# Patient Record
Sex: Female | Born: 1995 | Race: Black or African American | Hispanic: No | Smoking: Never smoker
Health system: Southern US, Community
[De-identification: ages and names within clinical notes are randomized; demographics above are authoritative.]

## PROBLEM LIST (undated history)

## (undated) DIAGNOSIS — Z789 Other specified health status: Secondary | ICD-10-CM

## (undated) DIAGNOSIS — A749 Chlamydial infection, unspecified: Secondary | ICD-10-CM

## (undated) HISTORY — DX: Chlamydial infection, unspecified: A74.9

## (undated) HISTORY — PX: NO PAST SURGERIES: SHX2092

---
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
## 2015-04-10 ENCOUNTER — Emergency Department (HOSPITAL_BASED_OUTPATIENT_CLINIC_OR_DEPARTMENT_OTHER): Payer: Medicaid Other

## 2015-04-10 ENCOUNTER — Emergency Department (HOSPITAL_BASED_OUTPATIENT_CLINIC_OR_DEPARTMENT_OTHER)
Admission: EM | Admit: 2015-04-10 | Discharge: 2015-04-10 | Disposition: A | Discharge status: Home / Self Care | Payer: Medicaid Other | Attending: Emergency Medicine | Admitting: Emergency Medicine

## 2015-04-10 ENCOUNTER — Encounter (HOSPITAL_BASED_OUTPATIENT_CLINIC_OR_DEPARTMENT_OTHER): Payer: Self-pay

## 2015-04-10 DIAGNOSIS — Z8619 Personal history of other infectious and parasitic diseases: Secondary | ICD-10-CM | POA: Diagnosis not present

## 2015-04-10 DIAGNOSIS — M546 Pain in thoracic spine: Secondary | ICD-10-CM

## 2015-04-10 DIAGNOSIS — Z3202 Encounter for pregnancy test, result negative: Secondary | ICD-10-CM | POA: Insufficient documentation

## 2015-04-10 DIAGNOSIS — R52 Pain, unspecified: Secondary | ICD-10-CM

## 2015-04-10 LAB — URINALYSIS, ROUTINE W REFLEX MICROSCOPIC
BILIRUBIN URINE: NEGATIVE
Glucose, UA: NEGATIVE mg/dL
Hgb urine dipstick: NEGATIVE
KETONES UR: NEGATIVE mg/dL
LEUKOCYTES UA: NEGATIVE
NITRITE: NEGATIVE
PROTEIN: NEGATIVE mg/dL
Specific Gravity, Urine: 1.021 (ref 1.005–1.030)
UROBILINOGEN UA: 1 mg/dL (ref 0.0–1.0)
pH: 8 (ref 5.0–8.0)

## 2015-04-10 LAB — PREGNANCY, URINE: PREG TEST UR: NEGATIVE

## 2015-04-10 MED ORDER — CYCLOBENZAPRINE HCL 10 MG PO TABS: 10.0000 mg | ORAL_TABLET | Freq: Two times a day (BID) | ORAL | Status: DC | PRN

## 2015-04-10 MED ORDER — NAPROXEN 500 MG PO TABS: 500.0000 mg | ORAL_TABLET | Freq: Two times a day (BID) | ORAL | Status: DC

## 2015-04-10 MED ORDER — HYDROCODONE-ACETAMINOPHEN 5-325 MG PO TABS
1.0000 | ORAL_TABLET | ORAL | Status: AC
  Administered 2015-04-10: 1 via ORAL
  Filled 2015-04-10: qty 1
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
## 2015-04-10 NOTE — Discharge Instructions (Signed)

## 2015-04-10 NOTE — ED Notes (Addendum)
C/o upper back pain since 1030am-denies recent injury-NAD- steady gait-pain worse with movement

## 2015-04-10 NOTE — ED Notes (Signed)
Patient transported to X-ray 

## 2017-12-01 LAB — OB RESULTS CONSOLE ABO/RH: RH Type: POSITIVE

## 2017-12-01 LAB — OB RESULTS CONSOLE ANTIBODY SCREEN: Antibody Screen: NEGATIVE

## 2018-06-13 NOTE — L&D Delivery Note (Signed)
OB/GYN Faculty Practice Delivery Note  Jarrett Sohoiffany Florer is a 23 y.o. G1P1001 s/p SVD at 4550w0d. She was admitted for spontaneous onset of labor, SROM.   ROM: 6h 3260m with clear fluid GBS Status: positive - one dose of PCN administered Maximum Maternal Temperature: Temp (24hrs), Avg:98 F (36.7 C), Min:97.7 F (36.5 C), Max:98.1 F (36.7 C)  Labor Progress: . Admitted in active labor . Minimal change from 4 to 5.5, pitocin started to help with frequency of contractions . Progressed to complete   Delivery Date/Time: 07/08/18 at 0559 Delivery: Called to room and patient was complete and pushing. Head delivered OA. No nuchal cord present. Shoulder and body delivered in usual fashion. Infant with spontaneous cry, placed on mother's abdomen, dried and stimulated. Cord clamped x 2 after 1-minute delay, and cut by father of baby. Cord blood drawn. Placenta delivered spontaneously with gentle cord traction. Fundus firm with massage and Pitocin. Labia, perineum, vagina, and cervix inspected inspected with sulcal laceration.   Placenta: spontaneous, intact, 3-vessel cord - to be discarded Complications: fetal decelerations with pushing - pushed in between contractions with excellent maternal effort Lacerations: sulcal repaired with 3-0 Vicryl EBL: 36ml Analgesia: none  Postpartum Planning [x]  message to sent to schedule follow-up  [x]  vaccines UTD  Infant: vigorous infant  APGARs 8, 9  3040g  Liahm Grivas S. Earlene PlaterWallace, DO OB/GYN Fellow, Faculty Practice

## 2018-06-27 LAB — OB RESULTS CONSOLE GC/CHLAMYDIA
Chlamydia: NEGATIVE
Gonorrhea: NEGATIVE

## 2018-06-27 LAB — OB RESULTS CONSOLE GBS: STREP GROUP B AG: POSITIVE

## 2018-06-30 DIAGNOSIS — O99820 Streptococcus B carrier state complicating pregnancy: Secondary | ICD-10-CM

## 2018-07-08 ENCOUNTER — Encounter (HOSPITAL_COMMUNITY): Payer: Self-pay

## 2018-07-08 ENCOUNTER — Inpatient Hospital Stay (HOSPITAL_COMMUNITY)
Admission: AD | Admit: 2018-07-08 | Discharge: 2018-07-10 | DRG: 807 | Disposition: A | Discharge status: Home / Self Care | Payer: PRIVATE HEALTH INSURANCE | Source: Ambulatory Visit | Attending: Obstetrics & Gynecology | Admitting: Obstetrics & Gynecology

## 2018-07-08 ENCOUNTER — Other Ambulatory Visit: Payer: Self-pay

## 2018-07-08 DIAGNOSIS — Z3A38 38 weeks gestation of pregnancy: Secondary | ICD-10-CM | POA: Diagnosis not present

## 2018-07-08 DIAGNOSIS — O99214 Obesity complicating childbirth: Secondary | ICD-10-CM | POA: Diagnosis present

## 2018-07-08 DIAGNOSIS — Z3483 Encounter for supervision of other normal pregnancy, third trimester: Secondary | ICD-10-CM | POA: Diagnosis present

## 2018-07-08 DIAGNOSIS — O99820 Streptococcus B carrier state complicating pregnancy: Secondary | ICD-10-CM

## 2018-07-08 DIAGNOSIS — O4202 Full-term premature rupture of membranes, onset of labor within 24 hours of rupture: Secondary | ICD-10-CM

## 2018-07-08 DIAGNOSIS — O99824 Streptococcus B carrier state complicating childbirth: Secondary | ICD-10-CM | POA: Diagnosis present

## 2018-07-08 HISTORY — DX: Other specified health status: Z78.9

## 2018-07-08 LAB — URINALYSIS, ROUTINE W REFLEX MICROSCOPIC
Bilirubin Urine: NEGATIVE
Glucose, UA: NEGATIVE mg/dL
HGB URINE DIPSTICK: NEGATIVE
Ketones, ur: NEGATIVE mg/dL
LEUKOCYTES UA: NEGATIVE
NITRITE: NEGATIVE
PROTEIN: NEGATIVE mg/dL
Specific Gravity, Urine: 1.014 (ref 1.005–1.030)
pH: 7 (ref 5.0–8.0)

## 2018-07-08 LAB — POCT FERN TEST: POCT FERN TEST: POSITIVE

## 2018-07-08 LAB — CBC
HCT: 34.6 % — ABNORMAL LOW (ref 36.0–46.0)
Hemoglobin: 11.6 g/dL — ABNORMAL LOW (ref 12.0–15.0)
MCH: 27.5 pg (ref 26.0–34.0)
MCHC: 33.5 g/dL (ref 30.0–36.0)
MCV: 82 fL (ref 80.0–100.0)
Platelets: 171 10*3/uL (ref 150–400)
RBC: 4.22 MIL/uL (ref 3.87–5.11)
RDW: 13.9 % (ref 11.5–15.5)
WBC: 9.4 10*3/uL (ref 4.0–10.5)
nRBC: 0 % (ref 0.0–0.2)

## 2018-07-08 LAB — TYPE AND SCREEN
ABO/RH(D): O POS
Antibody Screen: NEGATIVE

## 2018-07-08 LAB — ABO/RH: ABO/RH(D): O POS

## 2018-07-08 LAB — RPR: RPR Ser Ql: NONREACTIVE

## 2018-07-08 MED ORDER — ACETAMINOPHEN 325 MG PO TABS: 650.0000 mg | ORAL_TABLET | ORAL | Status: DC | PRN

## 2018-07-08 MED ORDER — EPHEDRINE 5 MG/ML INJ
10.0000 mg | INTRAVENOUS | Status: DC | PRN
  Filled 2018-07-08: qty 2

## 2018-07-08 MED ORDER — SIMETHICONE 80 MG PO CHEW: 80.0000 mg | CHEWABLE_TABLET | ORAL | Status: DC | PRN

## 2018-07-08 MED ORDER — ONDANSETRON HCL 4 MG PO TABS: 4.0000 mg | ORAL_TABLET | ORAL | Status: DC | PRN

## 2018-07-08 MED ORDER — OXYCODONE-ACETAMINOPHEN 5-325 MG PO TABS: 2.0000 | ORAL_TABLET | ORAL | Status: DC | PRN

## 2018-07-08 MED ORDER — SENNOSIDES-DOCUSATE SODIUM 8.6-50 MG PO TABS
2.0000 | ORAL_TABLET | ORAL | Status: DC
  Administered 2018-07-09 (×2): 2 via ORAL
  Filled 2018-07-08 (×2): qty 2

## 2018-07-08 MED ORDER — FENTANYL 2.5 MCG/ML BUPIVACAINE 1/10 % EPIDURAL INFUSION (WH - ANES): 14.0000 mL/h | INTRAMUSCULAR | Status: DC | PRN

## 2018-07-08 MED ORDER — LIDOCAINE HCL (PF) 1 % IJ SOLN
30.0000 mL | INTRAMUSCULAR | Status: AC | PRN
  Administered 2018-07-08: 30 mL via SUBCUTANEOUS
  Filled 2018-07-08: qty 30

## 2018-07-08 MED ORDER — LACTATED RINGERS IV SOLN: 500.0000 mL | Freq: Once | INTRAVENOUS | Status: DC

## 2018-07-08 MED ORDER — FENTANYL CITRATE (PF) 100 MCG/2ML IJ SOLN
100.0000 ug | INTRAMUSCULAR | Status: DC | PRN
  Administered 2018-07-08: 100 ug via INTRAVENOUS
  Filled 2018-07-08: qty 2

## 2018-07-08 MED ORDER — DIBUCAINE 1 % RE OINT: 1.0000 "application " | TOPICAL_OINTMENT | RECTAL | Status: DC | PRN

## 2018-07-08 MED ORDER — BENZOCAINE-MENTHOL 20-0.5 % EX AERO: 1.0000 "application " | INHALATION_SPRAY | CUTANEOUS | Status: DC | PRN

## 2018-07-08 MED ORDER — PHENYLEPHRINE 40 MCG/ML (10ML) SYRINGE FOR IV PUSH (FOR BLOOD PRESSURE SUPPORT)
80.0000 ug | PREFILLED_SYRINGE | INTRAVENOUS | Status: DC | PRN
  Filled 2018-07-08: qty 10

## 2018-07-08 MED ORDER — OXYTOCIN 40 UNITS IN NORMAL SALINE INFUSION - SIMPLE MED
2.5000 [IU]/h | INTRAVENOUS | Status: DC
  Filled 2018-07-08: qty 1000

## 2018-07-08 MED ORDER — ONDANSETRON HCL 4 MG/2ML IJ SOLN: 4.0000 mg | INTRAMUSCULAR | Status: DC | PRN

## 2018-07-08 MED ORDER — TERBUTALINE SULFATE 1 MG/ML IJ SOLN
0.2500 mg | Freq: Once | INTRAMUSCULAR | Status: DC | PRN
  Filled 2018-07-08: qty 1

## 2018-07-08 MED ORDER — OXYTOCIN 40 UNITS IN NORMAL SALINE INFUSION - SIMPLE MED
1.0000 m[IU]/min | INTRAVENOUS | Status: DC
  Administered 2018-07-08: 1.5 m[IU]/min via INTRAVENOUS

## 2018-07-08 MED ORDER — ONDANSETRON HCL 4 MG/2ML IJ SOLN
4.0000 mg | Freq: Four times a day (QID) | INTRAMUSCULAR | Status: DC | PRN
  Administered 2018-07-08: 4 mg via INTRAVENOUS
  Filled 2018-07-08: qty 2

## 2018-07-08 MED ORDER — LACTATED RINGERS IV SOLN: 500.0000 mL | INTRAVENOUS | Status: DC | PRN

## 2018-07-08 MED ORDER — DIPHENHYDRAMINE HCL 50 MG/ML IJ SOLN: 12.5000 mg | INTRAMUSCULAR | Status: DC | PRN

## 2018-07-08 MED ORDER — OXYTOCIN BOLUS FROM INFUSION
500.0000 mL | Freq: Once | INTRAVENOUS | Status: AC
  Administered 2018-07-08: 500 mL via INTRAVENOUS

## 2018-07-08 MED ORDER — COCONUT OIL OIL
1.0000 "application " | TOPICAL_OIL | Status: DC | PRN
  Administered 2018-07-10: 1 via TOPICAL
  Filled 2018-07-08: qty 120

## 2018-07-08 MED ORDER — DIPHENHYDRAMINE HCL 25 MG PO CAPS: 25.0000 mg | ORAL_CAPSULE | Freq: Four times a day (QID) | ORAL | Status: DC | PRN

## 2018-07-08 MED ORDER — IBUPROFEN 600 MG PO TABS
600.0000 mg | ORAL_TABLET | Freq: Four times a day (QID) | ORAL | Status: DC
  Administered 2018-07-08 – 2018-07-10 (×8): 600 mg via ORAL
  Filled 2018-07-08 (×8): qty 1

## 2018-07-08 MED ORDER — PRENATAL MULTIVITAMIN CH
1.0000 | ORAL_TABLET | Freq: Every day | ORAL | Status: DC
  Administered 2018-07-08 – 2018-07-09 (×2): 1 via ORAL
  Filled 2018-07-08 (×2): qty 1

## 2018-07-08 MED ORDER — TETANUS-DIPHTH-ACELL PERTUSSIS 5-2.5-18.5 LF-MCG/0.5 IM SUSP: 0.5000 mL | Freq: Once | INTRAMUSCULAR | Status: DC

## 2018-07-08 MED ORDER — LACTATED RINGERS IV SOLN
INTRAVENOUS | Status: DC
  Administered 2018-07-08: 02:00:00 via INTRAVENOUS

## 2018-07-08 MED ORDER — MEASLES, MUMPS & RUBELLA VAC IJ SOLR
0.5000 mL | Freq: Once | INTRAMUSCULAR | Status: DC
  Filled 2018-07-08: qty 0.5

## 2018-07-08 MED ORDER — ACETAMINOPHEN 325 MG PO TABS
650.0000 mg | ORAL_TABLET | ORAL | Status: DC | PRN
  Administered 2018-07-08 (×2): 650 mg via ORAL
  Filled 2018-07-08 (×2): qty 2

## 2018-07-08 MED ORDER — WITCH HAZEL-GLYCERIN EX PADS: 1.0000 "application " | MEDICATED_PAD | CUTANEOUS | Status: DC | PRN

## 2018-07-08 MED ORDER — PENICILLIN G 3 MILLION UNITS IVPB - SIMPLE MED
3.0000 10*6.[IU] | INTRAVENOUS | Status: DC
  Filled 2018-07-08: qty 100

## 2018-07-08 MED ORDER — ZOLPIDEM TARTRATE 5 MG PO TABS: 5.0000 mg | ORAL_TABLET | Freq: Every evening | ORAL | Status: DC | PRN

## 2018-07-08 MED ORDER — SODIUM CHLORIDE 0.9 % IV SOLN
5.0000 10*6.[IU] | INTRAVENOUS | Status: AC
  Administered 2018-07-08: 5 10*6.[IU] via INTRAVENOUS
  Filled 2018-07-08: qty 5

## 2018-07-08 MED ORDER — SOD CITRATE-CITRIC ACID 500-334 MG/5ML PO SOLN: 30.0000 mL | ORAL | Status: DC | PRN

## 2018-07-08 MED ORDER — OXYCODONE-ACETAMINOPHEN 5-325 MG PO TABS: 1.0000 | ORAL_TABLET | ORAL | Status: DC | PRN

## 2018-07-08 NOTE — Lactation Note (Signed)
This note was copied from a baby's chart. Lactation Consultation Note  Patient Name: Girl Minday Guinan Today's Date: 07/08/2018 Reason for consult: Initial assessment;Early term 37-38.6wks;Primapara;1st time breastfeeding  2 hours old early term female who is being partially BF and formula fed by her mother, she's a P1. Mom took BF classes at the [facility] office in the [facility]. LC showed mom how to hand express, but no colostrum was seen at this point. Mom has large breast and her nipples are very short shafted and semi-compressible. LC set her up with breast shells. Her RN has also set up a DEBP for her, but LC had to adjust the junctures in her pump, they're loose. Mom hasn't started pumping yet, but she will today; she's aware that pumping at this stage is mainly for breast stimulation and not to get volume. She has a Motif DEBP at home.   Baby was getting her hearing test when entering the room, as soon as NT was done, [facility] offered assistance with latch but mom politely declined, she was about to have her lunch. Asked mom to call for assistance when needed. Discussed cluster feeding, normal newborn behavior and pumping schedule.   Feeding plan:  1. Encouraged mom to feed baby STS 8-12 times/24 hours or sooner if feeding cues are present 2. Mom will pump after feedings and will feed baby any amount of EBM she may get 3. Mom will continue to supplement baby with [employer] Gentle in the meantime (waiting to get some colostrum) 4. She'll start wearing her breast shells tomorrow when her family member bring her nursing bra to the hospital  BF brochure, BF resources and feeding diary were reviewed. Mom reported all questions and concerns were answered, she's aware of LC services and will call PRN.  Maternal Data Formula Feeding for Exclusion: No Has patient been taught Hand Expression?: Yes Does the patient have breastfeeding experience prior to this delivery?: No  Feeding Feeding Type: Bottle Fed -  Formula  Interventions Interventions: Breast feeding basics reviewed;Breast massage;Hand express;Breast compression;Shells;Reverse pressure;DEBP  Lactation Tools Discussed/Used Tools: Shells;Pump Shell Type: Inverted Breast pump type: Double-Electric Breast Pump WIC Program: Yes Pump Review: Setup, frequency, and cleaning Initiated by:: RN and IBCLC (adjusted junctures in her pump) Date initiated:: 07/09/18   Consult Status Consult Status: Follow-up Date: 07/09/18 Follow-up type: In-patient    Malicia Blasdel Venetia Constable 07/08/2018, 3:18 PM

## 2018-07-08 NOTE — Discharge Summary (Signed)
Obstetrics Discharge Summary OB/GYN Faculty Practice   Patient Name: Jasmine Myers DOB: Jan 06, 1996 MRN: [id number]  Date of admission: 07/08/2018 Delivering MD: Rollene Rotunda A   Date of discharge: 07/10/2018  Admitting diagnosis: 38 WKS CTX AND PRESSURE Intrauterine pregnancy: [redacted]w[redacted]d     Secondary diagnosis:   Active Problems:   Indication for care in labor or delivery   GBS (group B Streptococcus carrier), +RV culture, currently pregnant   Morbid obesity [facility])    Discharge diagnosis: Term Pregnancy Delivered                                            Postpartum procedures: None  Complications: None  Outpatient Follow-Up: [ ]  07/12/2018 [facility] [ ]  plans for IUD for postpartum birth control   Hospital course: Jasmine Myers is a 23 y.o. [redacted]w[redacted]d who was admitted for spontaneous onset of labor, SROM. Her pregnancy was complicated by GBS positive, BMI 41. Her labor course was notable for admission in active labor, starting pitocin after minimal change for several hours, progressing to complete. Delivery was complicated by inadequate GBS prophylaxis, sulcal laceration. Please see delivery/op note for additional details. Her postpartum course was uncomplicated. She was breastfeeding without difficulty. By day of discharge, she was passing flatus, urinating, eating and drinking without difficulty. Her pain was well-controlled, and she was discharged home with ibuprofen and bowel regimen. She will follow-up in clinic in 3 days.   Physical exam  Vitals:   07/09/18 0640 07/09/18 1445 07/09/18 2234 07/10/18 0638  BP: 130/84 (!) 97/56 114/72 94/63  Pulse: 86 67 (!) 58 63  Resp: 20 18 18 16   Temp: 98 F (36.7 C) 98.3 F (36.8 C) 97.7 F (36.5 C) 97.9 F (36.6 C)  TempSrc: Oral Oral Oral Oral  SpO2: 98%  100%   Weight:      Height:       General: tired-appearing, NAD Lochia: appropriate Uterine Fundus: firm Incision: N/A DVT Evaluation: No significant  calf/ankle edema Labs: Lab Results  Component Value Date   WBC 9.4 07/08/2018   HGB 11.6 (L) 07/08/2018   HCT 34.6 (L) 07/08/2018   MCV 82.0 07/08/2018   PLT 171 07/08/2018   No flowsheet data found.  Discharge instructions: Per After Visit Summary and "Baby and Me Booklet"  After visit meds:  Allergies as of 07/10/2018   No Known Allergies     Medication List    TAKE these medications   ibuprofen 600 MG tablet Commonly known as:  ADVIL,MOTRIN Take 1 tablet (600 mg total) by mouth every 6 (six) hours.   prenatal multivitamin Tabs tablet Take 1 tablet by mouth daily at 12 noon.   senna-docusate 8.6-50 MG tablet Commonly known as:  Senokot-S Take 2 tablets by mouth at bedtime as needed for mild constipation.      Postpartum contraception: Undecided Diet: Routine Diet Activity: Advance as tolerated. Pelvic rest for 6 weeks.   Follow-up Appt:No future appointments. Message not sent as goes to [facility] practice Follow-up Visit:No follow-ups on file.   Newborn Data: Live born female  Birth Weight: 6 lb 11.2 oz (3040 g) APGAR: 8, 9  Newborn Delivery   Birth date/time:  07/08/2018 05:59:00 Delivery type:  Vaginal, Spontaneous    Baby Feeding: Breast Disposition:home with mother  Cristal Deer. Earlene Plater, DO OB/GYN Fellow, Faculty Practice

## 2018-07-08 NOTE — MAU Note (Signed)
Pt presents with cxts, approx 5-10 min apart and watery leakage starting around 11:50p yesterday. Pain 8/10. Pos FM. Pt lost mucus plug approx 1 wk ago.    Adah Perl RN

## 2018-07-08 NOTE — H&P (Addendum)
OBSTETRIC ADMISSION HISTORY AND PHYSICAL  Jasmine Myers is a 23 y.o. female G1P0 with IUP at [redacted]w[redacted]d by [place] presenting for contractions and ROM. She was admitted to labor and delivery for latent labor with SROM.   Reports fetal movement. Denies vaginal bleeding. Patient complains of contractions approximately 5-10 min apart with ROM approximately at 2350 on 07/07/2018. She has had no complications with prenatal care.   She received her prenatal care at [facility] .  Support person in labor: FOB  Ultrasounds . Anatomy U/S: normal (05/07/2018), posterior placenta, mild bilateral renal pelvic fullness   Prenatal History/Complications: Marland Kitchen GBS positive . Morbid obesity, BMI 41  Past Medical History: Past Medical History:  Diagnosis Date  . Chlamydia infection 07/29/2011  . Medical history non-contributory     Past Surgical History: Past Surgical History:  Procedure Laterality Date  . NO PAST SURGERIES      Obstetrical History: OB History    Gravida  1   Para      Term      Preterm      AB      Living        SAB      TAB      Ectopic      Multiple      Live Births              Social History: Social History   Socioeconomic History  . Marital status: Single    Spouse name: Not on file  . Number of children: Not on file  . Years of education: Not on file  . Highest education level: Not on file  Occupational History  . Not on file  Social Needs  . Financial resource strain: Not on file  . Food insecurity:    Worry: Never true    Inability: Never true  . Transportation needs:    Medical: No    Non-medical: No  Tobacco Use  . Smoking status: Never Smoker  . Smokeless tobacco: Never Used  Substance and Sexual Activity  . Alcohol use: No  . Drug use: No  . Sexual activity: Yes    Birth control/protection: None    Comment: last IC-yesterday   Lifestyle  . Physical activity:    Days per week: Patient refused    Minutes per  session: Patient refused  . Stress: Not on file  Relationships  . Social connections:    Talks on phone: More than three times a week    Gets together: More than three times a week    Attends religious service: More than 4 times per year    Active member of club or organization: Not on file    Attends meetings of clubs or organizations: Not on file    Relationship status: Living with partner  Other Topics Concern  . Not on file  Social History Narrative   ** Merged History Encounter **        Family History: History reviewed. No pertinent family history.  Allergies: No Known Allergies  Medications Prior to Admission  Medication Sig Dispense Refill Last Dose  . naproxen (NAPROSYN) 500 MG tablet Take 1 tablet (500 mg total) by mouth 2 (two) times daily with a meal. As needed for pain 20 tablet 0   . Prenatal Vit-Fe Fumarate-FA (PRENATAL MULTIVITAMIN) TABS tablet Take 1 tablet by mouth daily at 12 noon.   07/07/2018 at Unknown time  . cyclobenzaprine (FLEXERIL) 10 MG tablet Take 1  tablet (10 mg total) by mouth 2 (two) times daily as needed for muscle spasms. 20 tablet 0      Review of Systems  All systems reviewed and negative except as stated in HPI  Blood pressure (!) 102/49, pulse 94, temperature 98.1 F (36.7 C), temperature source Oral, resp. rate 20, height 5\' 5"  (1.651 m), weight 113.2 kg, SpO2 99 %. General appearance: alert, cooperative and moderate distress Lungs: no respiratory distress Heart: regular rate  Abdomen: soft, non-tender; gravid b Extremities: Homans sign is negative, no sign of DVT Presentation: cephalic Fetal monitoring: Cat 1 Dilation: 10 Effacement (%): 100 Station: Plus 1 Exam by:: Aundria Rud RN  Prenatal labs: ABO, Rh: --/--/O POS, O POS Performed at [facility], [address], [place], [place] [address]  (01/26 0150) Antibody: NEG (01/26 0150) Rubella:  Positive  RPR:   Non-reactive  HBsAg:   Non-reactive  HIV:   Non-reactive  GBS:  Positive (01/15 0000)  Genetic screening:  No AFP, Sickle cell neg  Prenatal Transfer Tool  Maternal Diabetes: No Genetic Screening: Declined Maternal Ultrasounds/Referrals: Normal Fetal Ultrasounds or other Referrals:  None Maternal Substance Abuse:  No Significant Maternal Medications:  None Significant Maternal Lab Results: None  Results for orders placed or performed during the hospital encounter of 07/08/18 (from the past 24 hour(s))  Urinalysis, Routine w reflex microscopic   Collection Time: 07/08/18 12:58 AM  Result Value Ref Range   Color, Urine YELLOW YELLOW   APPearance CLEAR CLEAR   Specific Gravity, Urine 1.014 1.005 - 1.030   pH 7.0 5.0 - 8.0   Glucose, UA NEGATIVE NEGATIVE mg/dL   Hgb urine dipstick NEGATIVE NEGATIVE   Bilirubin Urine NEGATIVE NEGATIVE   Ketones, ur NEGATIVE NEGATIVE mg/dL   Protein, ur NEGATIVE NEGATIVE mg/dL   Nitrite NEGATIVE NEGATIVE   Leukocytes, UA NEGATIVE NEGATIVE  POCT fern test   Collection Time: 07/08/18  1:24 AM  Result Value Ref Range   POCT Fern Test Positive = ruptured amniotic membanes   Type and screen [facility] [place]   Collection Time: 07/08/18  1:50 AM  Result Value Ref Range   ABO/RH(D) O POS    Antibody Screen NEG    Sample Expiration      07/11/2018 Performed at [facility], [address] [place], [place] [address]   ABO/Rh   Collection Time: 07/08/18  1:50 AM  Result Value Ref Range   ABO/RH(D)      O POS Performed at [facility], [address] [place], [place] [address]   CBC   Collection Time: 07/08/18  1:54 AM  Result Value Ref Range   WBC 9.4 4.0 - 10.5 K/uL   RBC 4.22 3.87 - 5.11 MIL/uL   Hemoglobin 11.6 (L) 12.0 - 15.0 g/dL   HCT 54.6 (L) 56.8 - 12.7 %   MCV 82.0 80.0 - 100.0 fL   MCH 27.5 26.0 - 34.0 pg   MCHC 33.5 30.0 - 36.0 g/dL   RDW 51.7 00.1 - 74.9 %   Platelets 171 150 - 400 K/uL   nRBC 0.0 0.0 - 0.2 %    Patient Active Problem List   Diagnosis Date Noted   . Indication for care in labor or delivery 07/08/2018  . GBS (group B Streptococcus carrier), +RV culture, currently pregnant 06/30/2018  . Morbid obesity (HCC) 11/20/2017    Assessment/Plan:  Karena Hamric is a 23 y.o. female G1P0 with IUP at [redacted]w[redacted]d by [place] presenting for contractions and ROM. She  was admitted to labor and delivery for early labor with SROM.   Labor: Expectant management. Will consider starting pitocin to help with contraction frequency.  -- pain control: IV  Fetal Wellbeing: Cephalic by bedside US.  -- GBS (+) -- continuous fetal monitoring - see above   Postpartum Planning -- girl/breast/undecided   Rollene RotundaHarrison Daves, DO PGYI Family Medicine   Attestation: I have seen this patient and agree with the resident's documentation. I have examined them separately, and we have discussed the plan of care.  Cristal DeerLaurel S. Earlene PlaterWallace, DO OB/GYN Fellow

## 2018-07-09 NOTE — Progress Notes (Signed)
Post Partum Day 1, s/p SVD Subjective: no complaints, doing well overall. Voiding without difficulty and ambulating.   Objective: Blood pressure 130/84, pulse 86, temperature 98 F (36.7 C), temperature source Oral, resp. rate 20, height 5\' 5"  (1.651 m), weight 113.2 kg, SpO2 98 %, unknown if currently breastfeeding.  Physical Exam:  General: alert Lochia: appropriate Uterine Fundus: firm Incision: n/a  DVT Evaluation: No evidence of DVT seen on physical exam.  Recent Labs    07/08/18 0154  HGB 11.6*  HCT 34.6*    Assessment/Plan: PPD#1, s/p SVD Recovering as expected Breast/bottlefeeding ; planning to see lactation today for support with breastfeeding.  Anticipate discharge tomorrow    LOS: 1 day   Sigurd Sos Bren Borys 07/09/2018, 7:23 AM

## 2018-07-09 NOTE — Lactation Note (Signed)
This note was copied from a baby's chart. Lactation Consultation Note  Patient Name: [name] [id number] Date: 07/09/2018 Reason for consult: Follow-up assessment;Early term 37-38.6wks;Primapara;1st time breastfeeding  P1 mother whose infant is now 28 hours old.  Mother had just finished giving 20 mls of formula prior to my arrival.  Pecola Leisure was asleep in the bassinet.  Mother's breasts are soft and non tender.  She has not been able to express any colostrum at this time.  Her nipples are short shafted and intact.  Mother has breast shells at bedside but has not been using them.  I encouraged her to begin using them.  She also has not been able to get baby to latch at all.  I suggested she observe for feeding cues and, at the next feed, have her RN/LC assist with latching.  Educated her on the importance of always putting baby to breast prior to any formula supplementation.  I explained that baby should begin waking up easier today and that we could show mother techniques on how to awaken a sleepy baby and how to obtain and maintain a deep latch at the breast.  Mother will call for assistance at the next feeding.  To help evert her nipples I provided a manual pump with instructions for use.  Mother will hand express and pre-pump before latching baby.  Encouraged breast massage and breast compressions during feedings.  Suggested mother focus more on breast feeding today and less on supplementation.  Mother verbalized understanding.  RN updated.   Maternal Data Formula Feeding for Exclusion: No Has patient been taught Hand Expression?: Yes Does the patient have breastfeeding experience prior to this delivery?: No  Feeding    LATCH Score                   Interventions    Lactation Tools Discussed/Used WIC Program: Yes Pump Review: Setup, frequency, and cleaning Initiated by:: Natanel Snavely Date initiated:: 07/09/18   Consult Status Consult Status: Follow-up Date:  07/10/18 Follow-up type: In-patient    Rishika Mccollom R Trentan Trippe 07/09/2018, 11:29 AM

## 2018-07-10 ENCOUNTER — Ambulatory Visit: Payer: Self-pay

## 2018-07-10 MED ORDER — IBUPROFEN 600 MG PO TABS: 600.0000 mg | ORAL_TABLET | Freq: Four times a day (QID) | ORAL | 0 refills | Status: DC

## 2018-07-10 MED ORDER — SENNOSIDES-DOCUSATE SODIUM 8.6-50 MG PO TABS: 2.0000 | ORAL_TABLET | Freq: Every evening | ORAL | 0 refills | Status: DC | PRN

## 2018-07-10 NOTE — Lactation Note (Signed)
This note was copied from a baby's chart. Lactation Consultation Note  Patient Name: Jasmine Myers [id number] Date: 07/10/2018 Reason for consult: Follow-up assessment Baby is in car seat and ready for discharge.  Mom has decided to pump and bottle feed.  She has a DEBP at home.  Instructed to pump every 3 hours.  Discussed milk coming to volume and the prevention and treatment of engorgement.  No questions or concerns.  Lactation outpatient services and support information reviewed and encouraged prn.  Maternal Data    Feeding Feeding Type: Formula Nipple Type: Slow - flow  LATCH Score                   Interventions    Lactation Tools Discussed/Used     Consult Status Consult Status: Complete Follow-up type: Call as needed    Huston Foley 07/10/2018, 10:42 AM

## 2019-09-06 LAB — HM PAP SMEAR: HM Pap smear: NORMAL

## 2021-03-09 LAB — HEPATITIS C ANTIBODY: HCV Ab: NEGATIVE

## 2021-03-09 LAB — OB RESULTS CONSOLE ABO/RH: RH Type: POSITIVE

## 2021-03-09 LAB — OB RESULTS CONSOLE RPR: RPR: NONREACTIVE

## 2021-03-09 LAB — HM PAP SMEAR: HM Pap smear: NEGATIVE

## 2021-03-09 LAB — OB RESULTS CONSOLE GC/CHLAMYDIA
Chlamydia: NEGATIVE
Gonorrhea: NEGATIVE

## 2021-03-09 LAB — OB RESULTS CONSOLE HEPATITIS B SURFACE ANTIGEN: Hepatitis B Surface Ag: NEGATIVE

## 2021-03-09 LAB — RESULTS CONSOLE HPV: CHL HPV: NEGATIVE

## 2021-03-09 LAB — OB RESULTS CONSOLE ANTIBODY SCREEN: Antibody Screen: NEGATIVE

## 2021-03-09 LAB — OB RESULTS CONSOLE HIV ANTIBODY (ROUTINE TESTING): HIV: NONREACTIVE

## 2021-03-09 LAB — OB RESULTS CONSOLE RUBELLA ANTIBODY, IGM: Rubella: IMMUNE

## 2021-06-04 ENCOUNTER — Other Ambulatory Visit: Payer: Self-pay | Admitting: Certified Nurse Midwife

## 2021-06-04 ENCOUNTER — Other Ambulatory Visit: Payer: Self-pay

## 2021-06-04 DIAGNOSIS — Z363 Encounter for antenatal screening for malformations: Secondary | ICD-10-CM

## 2021-06-08 ENCOUNTER — Encounter: Payer: Self-pay | Admitting: *Deleted

## 2021-06-13 NOTE — L&D Delivery Note (Signed)
Delivery Note ? ? ?Patient Name: Jasmine Myers ?DOB: January 04, 1996 ?MRN: [id number] ? ?Date of admission: 09/12/2021 ?Delivering MD: Duanne Limerick, Cheney Gosch  ?Date of delivery: 09/12/21 ?Type of delivery: SVD ? ?Newborn Data: ?Live born female  ?Birth Weight:   ?APGAR: 9, 9 ? ?Newborn Delivery   ?Birth date/time: 09/12/2021 15:22:00 ?Delivery type: Vaginal, Spontaneous ?  ?  ? ?Rosebud Kleman, 26 y.o., @ [redacted]w[redacted]d,  [id number], who was admitted for spontaneous labor in prodrmmal latent labor at 4.5cm, pt was augmented per desire with AROM, clear and pitocin of 2 and progressed to fully dilated on hands and knees. I was called to the room when she progressed 2+ station in the second stage of labor.  She pushed for 1/min.  She delivered a viable infant, cephalic and restituted to the ROA position over an intact perineum.  A nuchal cord   was not identified. The baby was placed on maternal abdomen while initial step of NRP were perfmored (Dry, Stimulated, and warmed). Hat placed on baby for thermoregulation. Delayed cord clamping was performed for 2 minutes.  Cord double clamped and cut.  Cord cut by FOB. Apgar scores were 9 and 9. Prophylactic Pitocin was started in the third stage of labor for active management. The placenta delivered spontaneously, shultz, with a 3 vessel cord and was sent to LD.  Inspection revealed none. An examination of the vaginal vault and cervix was free from lacerations. The uterus was firm, bleeding stable.   Placenta and umbilical artery blood gas were not sent.  There were no complications during the procedure.  Mom and baby skin to skin following delivery. Left in stable condition. ? ?Maternal Info: ?Anesthesia: Natural ?Episiotomy: no ?Lacerations:  no ?Suture Repair: no ?Est. Blood Loss (mL):  121mls ? ?Newborn Info: ? ?Baby Sex: female ?APGAR (1 MIN): 9   ?APGAR (5 MINS): 9   ?APGAR (10 MINS):   ? ? ?Mom to postpartum.  Baby to Couplet care / Skin to Skin. ? ? ?[facility], CNM, NP-C ?3:59 PM ?09/12/21 ? ? ? ?   ?

## 2021-06-16 ENCOUNTER — Ambulatory Visit: Payer: Medicaid Other | Attending: Certified Nurse Midwife

## 2021-06-16 ENCOUNTER — Ambulatory Visit (HOSPITAL_BASED_OUTPATIENT_CLINIC_OR_DEPARTMENT_OTHER): Payer: Medicaid Other | Admitting: Obstetrics

## 2021-06-16 ENCOUNTER — Other Ambulatory Visit: Payer: Self-pay

## 2021-06-16 ENCOUNTER — Other Ambulatory Visit: Payer: Self-pay | Admitting: *Deleted

## 2021-06-16 ENCOUNTER — Encounter: Payer: Self-pay | Admitting: *Deleted

## 2021-06-16 ENCOUNTER — Ambulatory Visit: Payer: Medicaid Other | Admitting: *Deleted

## 2021-06-16 ENCOUNTER — Other Ambulatory Visit: Payer: Self-pay | Admitting: Certified Nurse Midwife

## 2021-06-16 VITALS — BP 100/54 | HR 77

## 2021-06-16 DIAGNOSIS — O09892 Supervision of other high risk pregnancies, second trimester: Secondary | ICD-10-CM

## 2021-06-16 DIAGNOSIS — O365920 Maternal care for other known or suspected poor fetal growth, second trimester, not applicable or unspecified: Secondary | ICD-10-CM | POA: Insufficient documentation

## 2021-06-16 DIAGNOSIS — Z3A26 26 weeks gestation of pregnancy: Secondary | ICD-10-CM

## 2021-06-16 DIAGNOSIS — O99212 Obesity complicating pregnancy, second trimester: Secondary | ICD-10-CM | POA: Diagnosis present

## 2021-06-16 DIAGNOSIS — R638 Other symptoms and signs concerning food and fluid intake: Secondary | ICD-10-CM

## 2021-06-16 DIAGNOSIS — O365990 Maternal care for other known or suspected poor fetal growth, unspecified trimester, not applicable or unspecified: Secondary | ICD-10-CM | POA: Insufficient documentation

## 2021-06-16 DIAGNOSIS — Z363 Encounter for antenatal screening for malformations: Secondary | ICD-10-CM | POA: Diagnosis present

## 2021-06-17 NOTE — Progress Notes (Signed)
MFM Note  Jasmine Myers was seen for an ultrasound exam and consultation today due to fetal growth restriction that was noted on a recent ultrasound exam performed in your office.  Her pregnancy has also been complicated by maternal obesity with a BMI of 43.  She denies any other problems in her current pregnancy.   She had a cell free DNA test performed earlier in her pregnancy showing a low risk for trisomy 9, 18, and 13.  A female fetus is predicted.  On today's exam, the EFW (1 lb 9 oz) measures the 5th percentile for her gestational age indicating fetal growth restriction.  There was normal amniotic fluid noted.  Doppler studies of the umbilical arteries showed an elevated S/D ratio of 4.57.  There were no signs of absent or reversed end-diastolic flow.    The views of the fetal anatomy were limited today due to her advanced gestational age and maternal body habitus.  Fetal movements were noted throughout today's exam.   The implications and management of fetal growth restriction was discussed with the patient.  She was advised regarding the increased risk of an adverse pregnancy outcome such as stillbirth associated with IUGR, especially if the umbilical artery Doppler studies are abnormal.     She was reassured that fetal growth restriction is a common finding and that most cases result in the delivery of a healthy infant close to term.  Due to fetal growth restriction, we will continue to follow her closely with weekly fetal testing and weekly umbilical artery Doppler studies starting in 2 weeks.    As [place] as the umbilical artery Doppler studies continue to show forward diastolic flow, the BPP/NSTs remains reassuring, and the fetus continues to show growth, we will try to delay delivery until 34 to 37 weeks.  Fetal kick count instructions were reviewed.   At the end of the consultation, the patient stated that all of her questions have been answered to her complete satisfaction.    A total of 30 minutes was spent counseling and coordinating the care for this patient.  Greater than 50% of the time was spent in direct face-to-face contact.    Recommendations:  Start weekly umbilical artery Doppler studies in 2 weeks Start weekly NSTs in 2 weeks Goal for delivery would be at between 34 to 37 weeks depending on fetal status, her umbilical artery Doppler studies, and the fetal growth

## 2021-06-30 ENCOUNTER — Ambulatory Visit: Payer: PRIVATE HEALTH INSURANCE

## 2021-07-07 ENCOUNTER — Ambulatory Visit (HOSPITAL_BASED_OUTPATIENT_CLINIC_OR_DEPARTMENT_OTHER): Payer: Medicaid Other | Admitting: *Deleted

## 2021-07-07 ENCOUNTER — Ambulatory Visit: Payer: Medicaid Other | Admitting: *Deleted

## 2021-07-07 ENCOUNTER — Other Ambulatory Visit: Payer: Self-pay

## 2021-07-07 ENCOUNTER — Ambulatory Visit: Payer: Medicaid Other | Attending: Obstetrics

## 2021-07-07 ENCOUNTER — Encounter: Payer: Self-pay | Admitting: *Deleted

## 2021-07-07 VITALS — BP 116/54 | HR 70

## 2021-07-07 DIAGNOSIS — E668 Other obesity: Secondary | ICD-10-CM | POA: Diagnosis not present

## 2021-07-07 DIAGNOSIS — O365930 Maternal care for other known or suspected poor fetal growth, third trimester, not applicable or unspecified: Secondary | ICD-10-CM | POA: Diagnosis present

## 2021-07-07 DIAGNOSIS — Z3A29 29 weeks gestation of pregnancy: Secondary | ICD-10-CM | POA: Diagnosis not present

## 2021-07-07 DIAGNOSIS — O99213 Obesity complicating pregnancy, third trimester: Secondary | ICD-10-CM | POA: Diagnosis not present

## 2021-07-07 DIAGNOSIS — O365920 Maternal care for other known or suspected poor fetal growth, second trimester, not applicable or unspecified: Secondary | ICD-10-CM | POA: Insufficient documentation

## 2021-07-07 DIAGNOSIS — R638 Other symptoms and signs concerning food and fluid intake: Secondary | ICD-10-CM | POA: Diagnosis present

## 2021-07-07 NOTE — Procedures (Signed)
Jasmine Myers June 08, 1996 [redacted]w[redacted]d  Fetus A Non-Stress Test Interpretation for 07/07/21  Indication: IUGR  Fetal Heart Rate A Mode: External Baseline Rate (A): 140 bpm Variability: Moderate Accelerations: 10 x 10 Decelerations: None Multiple birth?: No  Uterine Activity Mode: Palpation, Toco Contraction Frequency (min): none Resting Tone Palpated: Relaxed  Interpretation (Fetal Testing) Nonstress Test Interpretation: Reactive Overall Impression: Reassuring for gestational age Comments: Dr. Grace Bushy reviewed tracing

## 2021-07-08 ENCOUNTER — Other Ambulatory Visit: Payer: Self-pay | Admitting: *Deleted

## 2021-07-08 DIAGNOSIS — Z6841 Body Mass Index (BMI) 40.0 and over, adult: Secondary | ICD-10-CM

## 2021-07-08 DIAGNOSIS — O365930 Maternal care for other known or suspected poor fetal growth, third trimester, not applicable or unspecified: Secondary | ICD-10-CM

## 2021-07-14 ENCOUNTER — Ambulatory Visit: Payer: Medicaid Other

## 2021-07-20 ENCOUNTER — Ambulatory Visit (HOSPITAL_BASED_OUTPATIENT_CLINIC_OR_DEPARTMENT_OTHER): Payer: Medicaid Other | Admitting: *Deleted

## 2021-07-20 ENCOUNTER — Encounter: Payer: Self-pay | Admitting: *Deleted

## 2021-07-20 ENCOUNTER — Ambulatory Visit: Payer: Medicaid Other | Attending: Maternal & Fetal Medicine

## 2021-07-20 ENCOUNTER — Ambulatory Visit: Payer: Medicaid Other | Admitting: *Deleted

## 2021-07-20 ENCOUNTER — Other Ambulatory Visit: Payer: Self-pay

## 2021-07-20 VITALS — BP 100/54 | HR 66

## 2021-07-20 DIAGNOSIS — O365931 Maternal care for other known or suspected poor fetal growth, third trimester, fetus 1: Secondary | ICD-10-CM

## 2021-07-20 DIAGNOSIS — O99213 Obesity complicating pregnancy, third trimester: Secondary | ICD-10-CM | POA: Insufficient documentation

## 2021-07-20 DIAGNOSIS — O365930 Maternal care for other known or suspected poor fetal growth, third trimester, not applicable or unspecified: Secondary | ICD-10-CM

## 2021-07-20 DIAGNOSIS — Z6841 Body Mass Index (BMI) 40.0 and over, adult: Secondary | ICD-10-CM

## 2021-07-20 DIAGNOSIS — Z3A30 30 weeks gestation of pregnancy: Secondary | ICD-10-CM | POA: Diagnosis not present

## 2021-07-20 NOTE — Procedures (Signed)
Jasmine Myers [birth year] [redacted]w[redacted]d  Fetus A Non-Stress Test Interpretation for 07/20/21  Indication: IUGR  Fetal Heart Rate A Mode: External Baseline Rate (A): 155 bpm Variability: Moderate Accelerations: 10 x 10 Decelerations: Variable (variables down to 130 with return to baseline over 10 sec)  Uterine Activity Mode: Toco Contraction Frequency (min): none Resting Tone Palpated: Relaxed  Interpretation (Fetal Testing) Nonstress Test Interpretation: Reactive Overall Impression: Reassuring for gestational age Comments: tracing reviewed by Dr. Grace Bushy

## 2021-07-28 ENCOUNTER — Ambulatory Visit: Payer: Medicaid Other | Attending: Obstetrics

## 2021-07-28 ENCOUNTER — Other Ambulatory Visit: Payer: Self-pay

## 2021-07-28 ENCOUNTER — Other Ambulatory Visit: Payer: Self-pay | Admitting: *Deleted

## 2021-07-28 ENCOUNTER — Encounter: Payer: Self-pay | Admitting: *Deleted

## 2021-07-28 ENCOUNTER — Ambulatory Visit: Payer: Medicaid Other | Admitting: *Deleted

## 2021-07-28 VITALS — BP 107/56 | HR 77

## 2021-07-28 DIAGNOSIS — O365931 Maternal care for other known or suspected poor fetal growth, third trimester, fetus 1: Secondary | ICD-10-CM

## 2021-07-28 DIAGNOSIS — Z3A32 32 weeks gestation of pregnancy: Secondary | ICD-10-CM | POA: Insufficient documentation

## 2021-07-28 DIAGNOSIS — O365930 Maternal care for other known or suspected poor fetal growth, third trimester, not applicable or unspecified: Secondary | ICD-10-CM

## 2021-07-28 DIAGNOSIS — O99213 Obesity complicating pregnancy, third trimester: Secondary | ICD-10-CM | POA: Insufficient documentation

## 2021-07-28 DIAGNOSIS — Z6841 Body Mass Index (BMI) 40.0 and over, adult: Secondary | ICD-10-CM

## 2021-07-28 DIAGNOSIS — O09893 Supervision of other high risk pregnancies, third trimester: Secondary | ICD-10-CM | POA: Diagnosis not present

## 2021-07-28 DIAGNOSIS — R638 Other symptoms and signs concerning food and fluid intake: Secondary | ICD-10-CM | POA: Diagnosis not present

## 2021-07-28 DIAGNOSIS — O289 Unspecified abnormal findings on antenatal screening of mother: Secondary | ICD-10-CM | POA: Diagnosis not present

## 2021-07-28 DIAGNOSIS — Z148 Genetic carrier of other disease: Secondary | ICD-10-CM | POA: Diagnosis not present

## 2021-07-28 DIAGNOSIS — O365920 Maternal care for other known or suspected poor fetal growth, second trimester, not applicable or unspecified: Secondary | ICD-10-CM

## 2021-07-28 DIAGNOSIS — Z3689 Encounter for other specified antenatal screening: Secondary | ICD-10-CM

## 2021-08-03 ENCOUNTER — Ambulatory Visit (HOSPITAL_BASED_OUTPATIENT_CLINIC_OR_DEPARTMENT_OTHER): Payer: Medicaid Other

## 2021-08-03 ENCOUNTER — Encounter: Payer: Self-pay | Admitting: *Deleted

## 2021-08-03 ENCOUNTER — Ambulatory Visit: Payer: Medicaid Other | Attending: Obstetrics | Admitting: *Deleted

## 2021-08-03 ENCOUNTER — Other Ambulatory Visit: Payer: Self-pay

## 2021-08-03 VITALS — BP 104/53 | HR 59

## 2021-08-03 DIAGNOSIS — Z6841 Body Mass Index (BMI) 40.0 and over, adult: Secondary | ICD-10-CM

## 2021-08-03 DIAGNOSIS — E669 Obesity, unspecified: Secondary | ICD-10-CM | POA: Insufficient documentation

## 2021-08-03 DIAGNOSIS — O99213 Obesity complicating pregnancy, third trimester: Secondary | ICD-10-CM | POA: Diagnosis not present

## 2021-08-03 DIAGNOSIS — O365930 Maternal care for other known or suspected poor fetal growth, third trimester, not applicable or unspecified: Secondary | ICD-10-CM

## 2021-08-03 DIAGNOSIS — Z3A32 32 weeks gestation of pregnancy: Secondary | ICD-10-CM

## 2021-08-04 ENCOUNTER — Ambulatory Visit: Payer: Medicaid Other

## 2021-08-11 ENCOUNTER — Other Ambulatory Visit: Payer: Self-pay

## 2021-08-11 ENCOUNTER — Encounter: Payer: Self-pay | Admitting: *Deleted

## 2021-08-11 ENCOUNTER — Ambulatory Visit: Payer: Medicaid Other | Admitting: *Deleted

## 2021-08-11 ENCOUNTER — Ambulatory Visit: Payer: Medicaid Other | Attending: Obstetrics and Gynecology

## 2021-08-11 VITALS — BP 109/61 | HR 77

## 2021-08-11 DIAGNOSIS — Z148 Genetic carrier of other disease: Secondary | ICD-10-CM

## 2021-08-11 DIAGNOSIS — O365930 Maternal care for other known or suspected poor fetal growth, third trimester, not applicable or unspecified: Secondary | ICD-10-CM

## 2021-08-11 DIAGNOSIS — Z3A34 34 weeks gestation of pregnancy: Secondary | ICD-10-CM

## 2021-08-11 DIAGNOSIS — O365931 Maternal care for other known or suspected poor fetal growth, third trimester, fetus 1: Secondary | ICD-10-CM | POA: Insufficient documentation

## 2021-08-11 DIAGNOSIS — O09893 Supervision of other high risk pregnancies, third trimester: Secondary | ICD-10-CM | POA: Diagnosis present

## 2021-08-11 DIAGNOSIS — Z6841 Body Mass Index (BMI) 40.0 and over, adult: Secondary | ICD-10-CM | POA: Diagnosis present

## 2021-08-11 DIAGNOSIS — Z3689 Encounter for other specified antenatal screening: Secondary | ICD-10-CM | POA: Diagnosis not present

## 2021-08-11 DIAGNOSIS — O99213 Obesity complicating pregnancy, third trimester: Secondary | ICD-10-CM | POA: Insufficient documentation

## 2021-08-19 ENCOUNTER — Ambulatory Visit: Payer: Medicaid Other | Attending: Obstetrics and Gynecology

## 2021-08-19 ENCOUNTER — Encounter: Payer: Self-pay | Admitting: *Deleted

## 2021-08-19 ENCOUNTER — Ambulatory Visit: Payer: Medicaid Other | Admitting: *Deleted

## 2021-08-19 ENCOUNTER — Other Ambulatory Visit: Payer: Self-pay

## 2021-08-19 VITALS — BP 130/62 | HR 80

## 2021-08-19 DIAGNOSIS — O99213 Obesity complicating pregnancy, third trimester: Secondary | ICD-10-CM | POA: Diagnosis not present

## 2021-08-19 DIAGNOSIS — O09893 Supervision of other high risk pregnancies, third trimester: Secondary | ICD-10-CM

## 2021-08-19 DIAGNOSIS — Z3A35 35 weeks gestation of pregnancy: Secondary | ICD-10-CM

## 2021-08-19 DIAGNOSIS — E669 Obesity, unspecified: Secondary | ICD-10-CM

## 2021-08-19 DIAGNOSIS — Z3689 Encounter for other specified antenatal screening: Secondary | ICD-10-CM | POA: Insufficient documentation

## 2021-08-19 DIAGNOSIS — O365931 Maternal care for other known or suspected poor fetal growth, third trimester, fetus 1: Secondary | ICD-10-CM | POA: Insufficient documentation

## 2021-08-19 DIAGNOSIS — O365930 Maternal care for other known or suspected poor fetal growth, third trimester, not applicable or unspecified: Secondary | ICD-10-CM

## 2021-08-24 LAB — OB RESULTS CONSOLE GBS: GBS: NEGATIVE

## 2021-08-25 ENCOUNTER — Ambulatory Visit: Payer: Medicaid Other | Admitting: *Deleted

## 2021-08-25 ENCOUNTER — Other Ambulatory Visit: Payer: Self-pay

## 2021-08-25 ENCOUNTER — Other Ambulatory Visit: Payer: Self-pay | Admitting: *Deleted

## 2021-08-25 ENCOUNTER — Ambulatory Visit: Payer: Medicaid Other | Attending: Obstetrics and Gynecology

## 2021-08-25 ENCOUNTER — Encounter: Payer: Self-pay | Admitting: *Deleted

## 2021-08-25 VITALS — BP 116/62 | HR 79

## 2021-08-25 DIAGNOSIS — O99213 Obesity complicating pregnancy, third trimester: Secondary | ICD-10-CM | POA: Diagnosis not present

## 2021-08-25 DIAGNOSIS — Z3689 Encounter for other specified antenatal screening: Secondary | ICD-10-CM

## 2021-08-25 DIAGNOSIS — Z3A36 36 weeks gestation of pregnancy: Secondary | ICD-10-CM

## 2021-08-25 DIAGNOSIS — O285 Abnormal chromosomal and genetic finding on antenatal screening of mother: Secondary | ICD-10-CM

## 2021-08-25 DIAGNOSIS — O365930 Maternal care for other known or suspected poor fetal growth, third trimester, not applicable or unspecified: Secondary | ICD-10-CM | POA: Diagnosis present

## 2021-08-25 DIAGNOSIS — Z148 Genetic carrier of other disease: Secondary | ICD-10-CM

## 2021-08-25 DIAGNOSIS — O365931 Maternal care for other known or suspected poor fetal growth, third trimester, fetus 1: Secondary | ICD-10-CM | POA: Diagnosis present

## 2021-08-25 DIAGNOSIS — O283 Abnormal ultrasonic finding on antenatal screening of mother: Secondary | ICD-10-CM

## 2021-08-25 DIAGNOSIS — O09893 Supervision of other high risk pregnancies, third trimester: Secondary | ICD-10-CM | POA: Diagnosis present

## 2021-08-26 ENCOUNTER — Telehealth: Payer: Self-pay

## 2021-08-26 NOTE — Telephone Encounter (Signed)
Left message for patient to call office for week 1 and week 2 appointments - from 08/25/21 ?

## 2021-08-31 ENCOUNTER — Other Ambulatory Visit: Payer: Self-pay

## 2021-08-31 ENCOUNTER — Ambulatory Visit: Payer: Medicaid Other | Admitting: *Deleted

## 2021-08-31 ENCOUNTER — Ambulatory Visit: Payer: Medicaid Other | Attending: Maternal & Fetal Medicine

## 2021-08-31 ENCOUNTER — Encounter: Payer: Self-pay | Admitting: *Deleted

## 2021-08-31 VITALS — BP 110/54 | HR 64

## 2021-08-31 DIAGNOSIS — E669 Obesity, unspecified: Secondary | ICD-10-CM

## 2021-08-31 DIAGNOSIS — O365930 Maternal care for other known or suspected poor fetal growth, third trimester, not applicable or unspecified: Secondary | ICD-10-CM | POA: Insufficient documentation

## 2021-08-31 DIAGNOSIS — Z3A36 36 weeks gestation of pregnancy: Secondary | ICD-10-CM

## 2021-08-31 DIAGNOSIS — Z3689 Encounter for other specified antenatal screening: Secondary | ICD-10-CM | POA: Diagnosis present

## 2021-08-31 DIAGNOSIS — O99213 Obesity complicating pregnancy, third trimester: Secondary | ICD-10-CM | POA: Insufficient documentation

## 2021-08-31 DIAGNOSIS — O283 Abnormal ultrasonic finding on antenatal screening of mother: Secondary | ICD-10-CM | POA: Insufficient documentation

## 2021-08-31 DIAGNOSIS — Z148 Genetic carrier of other disease: Secondary | ICD-10-CM

## 2021-09-07 ENCOUNTER — Ambulatory Visit: Payer: Medicaid Other

## 2021-09-08 ENCOUNTER — Telehealth (HOSPITAL_COMMUNITY): Payer: Self-pay | Admitting: *Deleted

## 2021-09-08 ENCOUNTER — Other Ambulatory Visit: Payer: Self-pay | Admitting: Obstetrics & Gynecology

## 2021-09-09 NOTE — Telephone Encounter (Signed)
Preadmission screen  

## 2021-09-10 ENCOUNTER — Telehealth (HOSPITAL_COMMUNITY): Payer: Self-pay | Admitting: *Deleted

## 2021-09-10 NOTE — Telephone Encounter (Signed)
Preadmission screen  

## 2021-09-12 ENCOUNTER — Other Ambulatory Visit: Payer: Self-pay

## 2021-09-12 ENCOUNTER — Inpatient Hospital Stay (HOSPITAL_COMMUNITY)
Admission: AD | Admit: 2021-09-12 | Discharge: 2021-09-14 | DRG: 807 | Disposition: A | Discharge status: Home / Self Care | Payer: Medicaid Other | Attending: Obstetrics and Gynecology | Admitting: Obstetrics and Gynecology

## 2021-09-12 ENCOUNTER — Encounter (HOSPITAL_COMMUNITY): Payer: Self-pay | Admitting: Obstetrics and Gynecology

## 2021-09-12 DIAGNOSIS — O26893 Other specified pregnancy related conditions, third trimester: Secondary | ICD-10-CM | POA: Diagnosis present

## 2021-09-12 DIAGNOSIS — D509 Iron deficiency anemia, unspecified: Secondary | ICD-10-CM | POA: Diagnosis present

## 2021-09-12 DIAGNOSIS — O9902 Anemia complicating childbirth: Secondary | ICD-10-CM | POA: Diagnosis present

## 2021-09-12 DIAGNOSIS — D62 Acute posthemorrhagic anemia: Secondary | ICD-10-CM | POA: Diagnosis not present

## 2021-09-12 DIAGNOSIS — Z3A38 38 weeks gestation of pregnancy: Secondary | ICD-10-CM

## 2021-09-12 DIAGNOSIS — O99214 Obesity complicating childbirth: Secondary | ICD-10-CM | POA: Diagnosis present

## 2021-09-12 LAB — CBC
HCT: 32.4 % — ABNORMAL LOW (ref 36.0–46.0)
Hemoglobin: 9.7 g/dL — ABNORMAL LOW (ref 12.0–15.0)
MCH: 23.2 pg — ABNORMAL LOW (ref 26.0–34.0)
MCHC: 29.9 g/dL — ABNORMAL LOW (ref 30.0–36.0)
MCV: 77.5 fL — ABNORMAL LOW (ref 80.0–100.0)
Platelets: 198 10*3/uL (ref 150–400)
RBC: 4.18 MIL/uL (ref 3.87–5.11)
RDW: 15.2 % (ref 11.5–15.5)
WBC: 9.9 10*3/uL (ref 4.0–10.5)
nRBC: 0 % (ref 0.0–0.2)

## 2021-09-12 LAB — TYPE AND SCREEN
ABO/RH(D): O POS
Antibody Screen: NEGATIVE

## 2021-09-12 MED ORDER — BENZOCAINE-MENTHOL 20-0.5 % EX AERO: 1.0000 "application " | INHALATION_SPRAY | CUTANEOUS | Status: DC | PRN

## 2021-09-12 MED ORDER — WITCH HAZEL-GLYCERIN EX PADS: 1.0000 "application " | MEDICATED_PAD | CUTANEOUS | Status: DC | PRN

## 2021-09-12 MED ORDER — OXYTOCIN BOLUS FROM INFUSION
333.0000 mL | Freq: Once | INTRAVENOUS | Status: AC
  Administered 2021-09-12: 333 mL via INTRAVENOUS

## 2021-09-12 MED ORDER — DIBUCAINE (PERIANAL) 1 % EX OINT: 1.0000 "application " | TOPICAL_OINTMENT | CUTANEOUS | Status: DC | PRN

## 2021-09-12 MED ORDER — OXYTOCIN-SODIUM CHLORIDE 30-0.9 UT/500ML-% IV SOLN
2.5000 [IU]/h | INTRAVENOUS | Status: DC
  Filled 2021-09-12: qty 500

## 2021-09-12 MED ORDER — COCONUT OIL OIL: 1.0000 "application " | TOPICAL_OIL | Status: DC | PRN

## 2021-09-12 MED ORDER — ZOLPIDEM TARTRATE 5 MG PO TABS: 5.0000 mg | ORAL_TABLET | Freq: Every evening | ORAL | Status: DC | PRN

## 2021-09-12 MED ORDER — SENNOSIDES-DOCUSATE SODIUM 8.6-50 MG PO TABS
2.0000 | ORAL_TABLET | Freq: Every day | ORAL | Status: DC
  Administered 2021-09-13: 2 via ORAL
  Filled 2021-09-12: qty 2

## 2021-09-12 MED ORDER — SOD CITRATE-CITRIC ACID 500-334 MG/5ML PO SOLN: 30.0000 mL | ORAL | Status: DC | PRN

## 2021-09-12 MED ORDER — SIMETHICONE 80 MG PO CHEW
80.0000 mg | CHEWABLE_TABLET | ORAL | Status: DC | PRN
Start: 2021-09-12 — End: 2021-09-14

## 2021-09-12 MED ORDER — LIDOCAINE HCL (PF) 1 % IJ SOLN: 30.0000 mL | INTRAMUSCULAR | Status: DC | PRN

## 2021-09-12 MED ORDER — TERBUTALINE SULFATE 1 MG/ML IJ SOLN: 0.2500 mg | Freq: Once | INTRAMUSCULAR | Status: DC | PRN

## 2021-09-12 MED ORDER — DIPHENHYDRAMINE HCL 25 MG PO CAPS: 25.0000 mg | ORAL_CAPSULE | Freq: Four times a day (QID) | ORAL | Status: DC | PRN

## 2021-09-12 MED ORDER — OXYCODONE-ACETAMINOPHEN 5-325 MG PO TABS: 2.0000 | ORAL_TABLET | ORAL | Status: DC | PRN

## 2021-09-12 MED ORDER — ONDANSETRON HCL 4 MG/2ML IJ SOLN: 4.0000 mg | INTRAMUSCULAR | Status: DC | PRN

## 2021-09-12 MED ORDER — OXYCODONE-ACETAMINOPHEN 5-325 MG PO TABS: 1.0000 | ORAL_TABLET | ORAL | Status: DC | PRN

## 2021-09-12 MED ORDER — TETANUS-DIPHTH-ACELL PERTUSSIS 5-2.5-18.5 LF-MCG/0.5 IM SUSY: 0.5000 mL | PREFILLED_SYRINGE | Freq: Once | INTRAMUSCULAR | Status: DC

## 2021-09-12 MED ORDER — LACTATED RINGERS IV SOLN: 500.0000 mL | INTRAVENOUS | Status: DC | PRN

## 2021-09-12 MED ORDER — SODIUM CHLORIDE 0.9 % IV SOLN
500.0000 mg | Freq: Once | INTRAVENOUS | Status: AC
  Administered 2021-09-12: 500 mg via INTRAVENOUS
  Filled 2021-09-12: qty 25

## 2021-09-12 MED ORDER — ONDANSETRON HCL 4 MG/2ML IJ SOLN
4.0000 mg | Freq: Four times a day (QID) | INTRAMUSCULAR | Status: DC | PRN
  Administered 2021-09-12: 4 mg via INTRAVENOUS
  Filled 2021-09-12: qty 2

## 2021-09-12 MED ORDER — ACETAMINOPHEN 325 MG PO TABS
650.0000 mg | ORAL_TABLET | ORAL | Status: DC | PRN
  Administered 2021-09-12: 650 mg via ORAL
  Filled 2021-09-12 (×2): qty 2

## 2021-09-12 MED ORDER — ONDANSETRON HCL 4 MG PO TABS: 4.0000 mg | ORAL_TABLET | ORAL | Status: DC | PRN

## 2021-09-12 MED ORDER — LACTATED RINGERS IV SOLN: INTRAVENOUS | Status: DC

## 2021-09-12 MED ORDER — ACETAMINOPHEN 325 MG PO TABS: 650.0000 mg | ORAL_TABLET | ORAL | Status: DC | PRN

## 2021-09-12 MED ORDER — OXYTOCIN-SODIUM CHLORIDE 30-0.9 UT/500ML-% IV SOLN
1.0000 m[IU]/min | INTRAVENOUS | Status: DC
  Administered 2021-09-12: 2 m[IU]/min via INTRAVENOUS

## 2021-09-12 MED ORDER — FENTANYL CITRATE (PF) 100 MCG/2ML IJ SOLN
50.0000 ug | INTRAMUSCULAR | Status: DC | PRN
  Administered 2021-09-12: 100 ug via INTRAVENOUS
  Filled 2021-09-12: qty 2

## 2021-09-12 MED ORDER — IBUPROFEN 600 MG PO TABS
600.0000 mg | ORAL_TABLET | Freq: Four times a day (QID) | ORAL | Status: DC
  Administered 2021-09-12 – 2021-09-14 (×7): 600 mg via ORAL
  Filled 2021-09-12 (×7): qty 1

## 2021-09-12 MED ORDER — PRENATAL MULTIVITAMIN CH
1.0000 | ORAL_TABLET | Freq: Every day | ORAL | Status: DC
  Administered 2021-09-13: 1 via ORAL
  Filled 2021-09-12: qty 1

## 2021-09-12 NOTE — H&P (Signed)
Jasmine Myers is a 26 y.o. female, G3P2002, IUP at 38.4 weeks, presenting for spontaneous latent labor with bloody show. GBS-. Pt endorse + Fm. Denies vaginal leakage. ? ?Patient Active Problem List  ? Diagnosis Date Noted  ? Indication for care in labor or delivery 07/08/2018  ? GBS (group B Streptococcus carrier), +RV culture, currently pregnant 06/30/2018  ? Morbid obesity (HCC) 11/20/2017  ?  ? ?Active Ambulatory Problems  ?  Diagnosis Date Noted  ? Indication for care in labor or delivery 07/08/2018  ? GBS (group B Streptococcus carrier), +RV culture, currently pregnant 06/30/2018  ? Morbid obesity (HCC) 11/20/2017  ? ?Resolved Ambulatory Problems  ?  Diagnosis Date Noted  ? Chlamydia 07/28/2010  ? ?Past Medical History:  ?Diagnosis Date  ? Chlamydia infection 07/29/2011  ? Medical history non-contributory   ?  ? ? ?Medications Prior to Admission  ?Medication Sig Dispense Refill Last Dose  ? Prenatal Vit-Fe Fumarate-FA (PRENATAL MULTIVITAMIN) TABS tablet Take 1 tablet by mouth daily at 12 noon.     ? ? ?Past Medical History:  ?Diagnosis Date  ? Chlamydia infection 07/29/2011  ? Medical history non-contributory   ?  ? ?No current facility-administered medications on file prior to encounter.  ? ?Current Outpatient Medications on File Prior to Encounter  ?Medication Sig Dispense Refill  ? Prenatal Vit-Fe Fumarate-FA (PRENATAL MULTIVITAMIN) TABS tablet Take 1 tablet by mouth daily at 12 noon.    ?  ? ?No Known Allergies ? ?History of present pregnancy: ?Pt Info/Preference:  Screening/Consents:  Labs:   ?EDD: Estimated Date of Delivery: 09/22/21  ?Establised: No LMP recorded. Patient is pregnant.  Anatomy Scan: Date: 07/20/2021 ?Placenta Location: posterior Genetic Screen: [place]:LR female ?AFP:  ?First Tri: ?Quad: ?Horizon: SMA caRRIER   ?Office: [facility]             First PNV: 12.6 weeks Blood Type O/Positive/-- (09/27 0000)  ?Language: english Last PNV: 37.5 weeks Rhogam    ?Flu Vaccine:  utd   Antibody Negative (09/27  0000)  ?TDaP vaccine utd   GTT: Early: 5.3 ?Third Trimester: 6780  ?Feeding Plan: breast BTL: no Rubella: Immune (09/27 0000)  ?Contraception: ??? VBAC: no RPR: Nonreactive (09/27 0000)   ?Circumcision: Female   HBsAg: Negative (09/27 0000)  ?Pediatrician:  ???   HIV: Non-reactive (09/27 0000)   ?Prenatal Classes: no Additional US: 3/21 BPP 8/8, 3/15 EFW 5.9lbs 21%.  GBS: Negative/-- (03/14 0000)(For PCN allergy, check sensitivities)   ?    Chlamydia: neg  ?  MFM Referral/Consult:  GC: neg  ?Support Person: partner   PAP: ???  ?Pain Management: epidural Neonatologist Referral:  Hgb Electrophoresis:  AA  ?[facility]   Hgb NOB: 11.3    28W: 10.1  ? ?OB History   ? ? Gravida  ?3  ? Para  ?2  ? Term  ?2  ? Preterm  ?   ? AB  ?   ? Living  ?2  ?  ? ? SAB  ?   ? IAB  ?   ? Ectopic  ?   ? Multiple  ?0  ? Live Births  ?2  ?   ?  ?  ? ?Past Medical History:  ?Diagnosis Date  ? Chlamydia infection 07/29/2011  ? Medical history non-contributory   ? ?Past Surgical History:  ?Procedure Laterality Date  ? NO PAST SURGERIES    ? ?Family History: family history includes Asthma in her maternal aunt; Cancer in her maternal grandmother and  maternal uncle; Diabetes in her maternal aunt; Hypertension in her maternal uncle. ?Social History:  reports that she has never smoked. She has never used smokeless tobacco. She reports that she does not drink alcohol and does not use drugs. ? ? ?Prenatal Transfer Tool  ?Maternal Diabetes: No ?Genetic Screening: Declined Horizon showed SMA carrier.  ?Maternal Ultrasounds/Referrals: Normal ?Fetal Ultrasounds or other Referrals:  None ?Maternal Substance Abuse:  No ?Significant Maternal Medications:  None ?Significant Maternal Lab Results: Group B Strep negative ? ?ROS:  ?Review of Systems  ?Constitutional: Negative.   ?HENT: Negative.    ?Eyes: Negative.   ?Respiratory: Negative.    ?Cardiovascular: Negative.   ?Gastrointestinal:  Positive for abdominal pain.  ?Genitourinary: Negative.    ?Musculoskeletal: Negative.   ?Skin: Negative.   ?Neurological: Negative.   ?Endo/Heme/Allergies: Negative.   ?Psychiatric/Behavioral: Negative.     ? ?Physical Exam: ?BP 111/68   Pulse 88   Temp 98 ?F (36.7 ?C) (Oral)   Resp 18   Wt 109.5 kg   SpO2 100% Comment: room air  BMI 40.19 kg/m?   ?Physical Exam ?Vitals and nursing note reviewed.  ?Constitutional:   ?   Appearance: Normal appearance.  ?HENT:  ?   Head: Normocephalic and atraumatic.  ?   Nose: Nose normal.  ?   Mouth/Throat:  ?   Mouth: Mucous membranes are moist.  ?Eyes:  ?   Conjunctiva/sclera: Conjunctivae normal.  ?Cardiovascular:  ?   Rate and Rhythm: Normal rate and regular rhythm.  ?   Heart sounds: Normal heart sounds.  ?Pulmonary:  ?   Effort: Pulmonary effort is normal.  ?   Breath sounds: Normal breath sounds.  ?Abdominal:  ?   General: Bowel sounds are normal.  ?Musculoskeletal:     ?   General: Normal range of motion.  ?   Cervical back: Normal range of motion and neck supple.  ?Skin: ?   General: Skin is warm.  ?   Capillary Refill: Capillary refill takes less than 2 seconds.  ?Neurological:  ?   General: No focal deficit present.  ?   Mental Status: She is alert.  ?Psychiatric:     ?   Mood and Affect: Mood normal.  ?  ? ?NST: FHR baseline 135 bpm, Variability: moderate, Accelerations:present, Decelerations:  Absent= Cat 1/Reactive ?UC:   irregular, every 2-6 mins minutes ?SVE:   Dilation: 4.5 ?Effacement (%): 80 ?Station: -2 ?Exam by:: Venia Carbon NP, vertex verified by fetal sutures.  ?Leopold's: Position vertex, EFW 7.5lbs via leopold's.  ? ?Labs: ?No results found for this or any previous visit (from the past 24 hour(s)). ? ?Imaging:  ?[place] MFM FETAL BPP WO NON STRESS ? ?Result Date: 08/31/2021 ?----------------------------------------------------------------------  OBSTETRICS REPORT                       (Signed Final 08/31/2021 05:46 pm) ---------------------------------------------------------------------- Patient Info  ID #:        [id number]                          D.O.B.:  December 08, 1995 (25 yrs)  Name:       Jasmine Myers [facility]                    Visit Date: 08/31/2021 11:39 am ---------------------------------------------------------------------- Performed By  Attending:        Lin Landsman      Ref. Address:     [place]  [place]                    MD                                                             Obstetrics &                                                             Gynecology                                                             [address]                                                             [address]                                                             [place], [place]                                                             [address]  Performed By:     Emeline Darling BS,      Location[facility]         [facility]                    RDMS                                     F[facility] at                                                             [facility]                                                             [facility]  Referred By:      Nigel Bridgeman                    CNM ---------------------------------------------------------------------- Orders  #  Description                           Code        Ordered By  1  [place] MFM FETAL BPP WO NON               76819.01    [place]     STRESS                                            BOOKER  2  [place] MFM UA CORD DOPPLER                76820.02    Lin Landsman ----------------------------------------------------------------------  #  Order #                     Accession #                Episode #  1  [id number]                   [id number]                 [id number]  2  [id number]                   [id number]                 [id number] ---------------------------------------------------------------------- Indications  Obesity complicating  pregnancy, third          O99.213  trimester (BMI 43)  Maternal care for known or suspected poor      O36.5930  fetal growth, third trimester, not applicable or  unspecified IUGR  Short interval between pregancies, 3rd

## 2021-09-12 NOTE — MAU Provider Note (Signed)
S: Ms. Jasmine Myers is a 26 y.o. G3P2002 at [redacted]w[redacted]d  who presents to MAU today complaining of leaking of fluid since 0900 with bloody show and a few clots noted.  She endorses vaginal bleeding. She endorses contractions. She reports normal fetal movement.   ? ?O: BP 108/67 (BP Location: Right Arm)   Pulse 74   Temp 98 ?F (36.7 ?C) (Oral)   Resp 18   Wt 109.5 kg   SpO2 100% Comment: room air  BMI 40.19 kg/m?  ?GENERAL: Well-developed, well-nourished female in no acute distress.  ?HEAD: Normocephalic, atraumatic.  ?CHEST: Normal effort of breathing, regular heart rate ?ABDOMEN: Soft, nontender, gravid ?PELVIC: Normal external female genitalia. Vagina is pink and rugated. Cervix with normal contour, no lesions. Normal discharge.  Negative pooling.  ?Moderate bloody show noted.  ? ?Cervical exam:  4 cm, 70%, -2, bulging membranes noted. Dark, bloody show noted.  ? ?2nd cervical exam: Dilation: 4.5 ?Effacement (%): 80 ?Station: -2 ?Presentation: Vertex- Membranes bulging from cervix.  ?Exam by:: Noni Saupe NP  ? ?Fetal Monitoring: Category 1 fetal monitoring.  ? ? ?No results found for this or any previous visit (from the past 24 hour(s)). ? ? ?A: ?SIUP at [redacted]w[redacted]d  ?Membranes intact ?Active labor.  ? ?P: ? ?Discussed patient with Dr. Delora Fuel ?Will admit for labor.  ?Patient aware and agreeable ?GBS negative  ? ?Lezlie Lye, NP ?09/12/2021 10:48 AM  ?

## 2021-09-12 NOTE — Lactation Note (Signed)
This note was copied from a baby's chart. ?Lactation Consultation Note ? ?Patient Name: Jasmine Myers ?Today's Date: 09/12/2021 ?  ?Age:27 hours ? ?Attemped to visit with mom in L&D but L&D RN Norwood Levo voiced that mom would rather be seen once she's transferred to her room. LC to visit mom in 508 for her initial lactation assessment. ? ? ?Seren Chaloux S Berdene Askari ?09/12/2021, 5:14 PM ? ? ? ?

## 2021-09-12 NOTE — Progress Notes (Signed)
Patient states she does not want Iron infusion or pain medication until after sh eats dinner ?

## 2021-09-12 NOTE — Lactation Note (Signed)
This note was copied from a baby's chart. ?Lactation Consultation Note ? ?Patient Name: Jasmine Myers ?Today's Date: 09/12/2021 ?Reason for consult: Early term 55-38.6wks;Initial assessment ?Age:26 years ? ?Visited with mom of 4 hours old ETI female; she's a P3 and breastfed her second child for 4 months. Offered assistance with latch but mom voiced she just gave baby a bottle of formula. Asked mom to call for assistance when needed.  ? ?Noticed that baby just had 23 ml of Similac 20 calorie formula at 4 hours of life, educated parents on supplementation guidelines according to baby's age in hours, size of baby's stomach feeding cues, cluster feeding, lactogenesis II and newborn behavior. Ms. Riney voiced understanding. ? ?Maternal Data ?Has patient been taught Hand Expression?: Yes ?Does the patient have breastfeeding experience prior to this delivery?: Yes ?How [place] did the patient breastfeed?: BF baby # 2 for 4 months ? ?Feeding ?Mother's Current Feeding Choice: Breast Milk and Formula ?Nipple Type: Slow - flow ? ?Interventions ?Interventions: Breast feeding basics reviewed;Education;LC Services brochure ? ?Plan of care ?Encouraged mom to put baby to breast 8-12 times/24 hours or sooner if feeding cues are present ?Parents will continue supplementing with formula per feeding choice on admission. Will try following supplementation guidelines according to baby's age in hours ? ?FOB and female visitor present. All questions and concerns answered, family to contact [facility] services PRN. ? ?Discharge ?Pump: Personal (Wireless Mom Cozy) ? ?Consult Status ?Consult Status: Follow-up ?Date: 09/13/21 ?Follow-up type: In-patient ? ? ?Ankush Gintz S Charma Mocarski ?09/12/2021, 7:32 PM ? ? ? ?

## 2021-09-12 NOTE — MAU Note (Signed)
Jasmine Myers is a 26 y.o. at [redacted]w[redacted]d here in MAU reporting: has been having contractions and then around 9 noticed some bleeding and saw a blood clot. Contractions are about every 6-7 minutes. Also noticed that she has been leaking watery discharge all night, fluid is clear. No recent IC. +FM.  ? ?Onset of complaint: today ? ?Pain score: 7/10 ? ?Vitals:  ? 09/12/21 1012  ?BP: 108/67  ?Pulse: 74  ?Resp: 18  ?Temp: 98 ?F (36.7 ?C)  ?SpO2: 100%  ?   ?FHT:138 ? ?Lab orders placed from triage: none ? ?

## 2021-09-13 LAB — CBC
HCT: 27.4 % — ABNORMAL LOW (ref 36.0–46.0)
Hemoglobin: 8.7 g/dL — ABNORMAL LOW (ref 12.0–15.0)
MCH: 23.8 pg — ABNORMAL LOW (ref 26.0–34.0)
MCHC: 31.8 g/dL (ref 30.0–36.0)
MCV: 75.1 fL — ABNORMAL LOW (ref 80.0–100.0)
Platelets: 182 10*3/uL (ref 150–400)
RBC: 3.65 MIL/uL — ABNORMAL LOW (ref 3.87–5.11)
RDW: 15.1 % (ref 11.5–15.5)
WBC: 12 10*3/uL — ABNORMAL HIGH (ref 4.0–10.5)
nRBC: 0 % (ref 0.0–0.2)

## 2021-09-13 LAB — RPR: RPR Ser Ql: NONREACTIVE

## 2021-09-13 MED ORDER — POLYSACCHARIDE IRON COMPLEX 150 MG PO CAPS: 150.0000 mg | ORAL_CAPSULE | Freq: Every day | ORAL | Status: DC

## 2021-09-13 MED ORDER — OXYCODONE HCL 5 MG PO TABS
5.0000 mg | ORAL_TABLET | Freq: Four times a day (QID) | ORAL | Status: DC | PRN
  Administered 2021-09-13 (×2): 5 mg via ORAL
  Filled 2021-09-13 (×2): qty 1

## 2021-09-13 NOTE — Progress Notes (Signed)
Post Partum Day 1 ?Subjective: ?up ad lib, voiding, tolerating PO, and Pt said something is coming form her vagina ? ?Objective: ?Blood pressure 106/67, pulse 69, temperature 98 ?F (36.7 ?C), temperature source Oral, resp. rate 17, height 5\' 5"  (1.651 m), weight 109.5 kg, SpO2 100 %, unknown if currently breastfeeding. ? ?Physical Exam:  ?General: alert and cooperative ?Lochia: no bulge seen.  The nurse said it was her cervix and easily placed ?Uterine Fundus: firm ?Incision: na ?DVT Evaluation: No evidence of DVT seen on physical exam. ? ?Recent Labs  ?  09/12/21 ?1325 09/13/21 ?0358  ?HGB 9.7* 8.7*  ?HCT 32.4* 27.4*  ? ? ?Assessment/Plan: ?Plan for discharge tomorrow ?Pt told to do kegles and we will evaluate again at post partum visit ? ? LOS: 1 day  ? ?Raidyn Breiner A Rethel Sebek ?09/13/2021, 3:26 PM  ? ? ?

## 2021-09-13 NOTE — Lactation Note (Signed)
This note was copied from a baby's chart. ?Lactation Consultation Note ? ?Patient Name: Jasmine Myers ?Today's Date: 09/13/2021 ?  ?Age:27 hours ?Per mom she feels BF is going well  no concerns about infant latch, she is currently using formula due to infant having elevated billi level. ?LC suggested mom latch infant at the breast first for every feeding and then afterwards supplement infant with her EBM and/ then formula. ?Per mom, this is her 1st time pumping, LC discussed the importance of using DEBP to help stimulate and establish her milk supply. ?Mom knows to pump every 3 hours for 15 minutes on initial setting. ?Mom knows she can call LC if she would like assistance with latching infant at the breast. ?Maternal Data ?  ? ?Feeding ?Nipple Type: Slow - flow ? ?LATCH Score ?  ? ?  ? ?  ? ?  ? ?  ? ?  ? ? ?Lactation Tools Discussed/Used ?  ? ?Interventions ?  ? ?Discharge ?  ? ?Consult Status ?  ? ? ? ?Danelle Earthly ?09/13/2021, 4:20 PM ? ? ? ?

## 2021-09-14 LAB — BIRTH TISSUE RECOVERY COLLECTION (PLACENTA DONATION)

## 2021-09-14 MED ORDER — IBUPROFEN 600 MG PO TABS: 600.0000 mg | ORAL_TABLET | Freq: Four times a day (QID) | ORAL | 0 refills | Status: DC

## 2021-09-14 MED ORDER — POLYSACCHARIDE IRON COMPLEX 150 MG PO CAPS: 150.0000 mg | ORAL_CAPSULE | Freq: Every day | ORAL | 0 refills | Status: DC

## 2021-09-14 MED ORDER — ACETAMINOPHEN 325 MG PO TABS: 650.0000 mg | ORAL_TABLET | ORAL | Status: DC | PRN

## 2021-09-14 NOTE — Discharge Summary (Signed)
? ?  Postpartum Discharge Summary ? ?Date of Service updated 09/14/21 ?   ?Patient Name: Jasmine Myers ?DOB: 26-Jul-1995 ?MRN: [id number] ? ?Date of admission: 09/12/2021 ?Delivery date:09/12/2021  ?Delivering provider: MONTANA, JADE  ?Date of discharge: 09/14/2021 ? ?Admitting diagnosis: Normal labor and delivery [O80] ?Intrauterine pregnancy: [redacted]w[redacted]d    ?Secondary diagnosis:  Principal Problem: ?  Normal labor and delivery ?Active Problems: ?  SVD (spontaneous vaginal delivery) ?  Normal postpartum course ?  IDA (iron deficiency anemia) ? ?Additional problems: none    ?Discharge diagnosis: Term Pregnancy Delivered                                              ?Post partum procedures: none ?Augmentation: AROM ?Complications: None ? ?Hospital course: Onset of Labor With Vaginal Delivery      ?26y.o. yo G3P3003 at 358w4das admitted in Latent Labor on 09/12/2021. Patient had an uncomplicated labor course as follows:  ?Membrane Rupture Time/Date: 2:10 PM ,09/12/2021   ?Delivery Method:Vaginal, Spontaneous  ?Episiotomy: None  ?Lacerations:  None  ?Patient had an uncomplicated postpartum course.  She is ambulating, tolerating a regular diet, passing flatus, and urinating well. Patient is discharged home in stable condition on 09/14/21. ? ?Newborn Data: ?Birth date:09/12/2021  ?Birth time:3:22 PM  ?Gender:Female  ?Living status:Living  ?Apgars:9 ,9  ?Weight:2985 g  ? ?Magnesium Sulfate received: No ?BMZ received: No ?Rhophylac:N/A ?MMR:N/A ?T-DaP:Given prenatally ?Flu: Yes ?Transfusion:No ? ?Physical exam  ?Vitals:  ? 09/13/21 0805 09/13/21 1505 09/13/21 2133 09/14/21 0500  ?BP: 106/67 96/66 106/62 112/66  ?Pulse: 69 61 65 71  ?Resp: _0 ?Temp: 98 ?F (36.7 ?C) 98.3 ?F (36.8 ?C) 98.5 ?F (36.9 ?C) 98 ?F (36.7 ?C)  ?TempSrc: Oral Oral Oral Oral  ?SpO2:   100% 100%  ?Weight:      ?Height:      ? ?General: alert, cooperative, and no distress ?Lochia: appropriate ?Uterine Fundus: firm ?Incision: N/A ?DVT Evaluation: No evidence of  DVT seen on physical exam. ?No cords or calf tenderness. ?No significant calf/ankle edema. ?Labs: ?Lab Results  ?Component Value Date  ? WBC 12.0 (H) 09/13/2021  ? HGB 8.7 (L) 09/13/2021  ? HCT 27.4 (L) 09/13/2021  ? MCV 75.1 (L) 09/13/2021  ? PLT 182 09/13/2021  ? ?   ? View : No data to display.  ?  ?  ?  ? ?Edinburgh Score: ? ?  09/13/2021  ?  8:05 AM  ?EdFlavia Shipperostnatal Depression Scale Screening Tool  ?I have been able to laugh and see the funny side of things. 0  ?I have looked forward with enjoyment to things. 0  ?I have blamed myself unnecessarily when things went wrong. 0  ?I have been anxious or worried for no good reason. 0  ?I have felt scared or panicky for no good reason. 0  ?Things have been getting on top of me. 0  ?I have been so unhappy that I have had difficulty sleeping. 0  ?I have felt sad or miserable. 0  ?I have been so unhappy that I have been crying. 0  ?The thought of harming myself has occurred to me. 0  ?Edinburgh Postnatal Depression Scale Total 0  ? ? ? ? ?After visit meds:  ?Allergies as of 09/14/2021   ?No Known Allergies ?  ? ?  ?Medication List  ?  ? ?  TAKE these medications   ? ?acetaminophen 325 MG tablet ?Commonly known as: Tylenol ?Take 2 tablets (650 mg total) by mouth every 4 (four) hours as needed (for pain scale < 4). ?  ?ibuprofen 600 MG tablet ?Commonly known as: ADVIL ?Take 1 tablet (600 mg total) by mouth every 6 (six) hours. ?  ?iron polysaccharides 150 MG capsule ?Commonly known as: NIFEREX ?Take 1 capsule (150 mg total) by mouth daily. ?  ?prenatal multivitamin Tabs tablet ?Take 1 tablet by mouth daily at 12 noon. ?  ? ?  ? ? ? ?Discharge home in stable condition ?Infant Feeding: Bottle and Breast ?Infant Disposition:home with mother ?Discharge instruction: per After Visit Summary and Postpartum booklet. ?Activity: Advance as tolerated. Pelvic rest for 6 weeks.  ?Diet: routine diet ?Anticipated Birth Control:  considering interval bilateral salpingectomy ?Postpartum  Appointment:6 weeks ?Additional Postpartum F/U:  none ?Future Appointments:No future appointments. ?Follow up Visit: ? Follow-up Information   ? ? [facility]. Schedule an appointment as soon as possible for a visit in 6 week(s).   ?Specialty: Obstetrics and Gynecology ?Contact information: ?[address] ?[address] ?[place] [address] ?[phone] ? ?  ?  ? ?  ?  ? ?  ? ? ? ?  ? ?09/14/2021 ?Arrie Eastern, CNM ? ? ?

## 2021-09-14 NOTE — Lactation Note (Signed)
This note was copied from a baby's chart. ?Lactation Consultation Note ? ?Patient Name: Jasmine Myers ?Today's Date: 09/14/2021 ?Reason for consult: Follow-up assessment;Early term 37-38.6wks;Infant weight loss;Other (Comment) (per mom baby had recently fed and LC reviewed and updated the doc flow sheets with mom.) ?Age:27 hours ?LC reviewed BF D/C teaching and mom aware of the [facility] resources.  ?LC recommended F/U with LC O/P and mom receptive . Mom aware she will receive a phone call .  ? ?Maternal Data ?  ? ?Feeding ?Mother's Current Feeding Choice: Breast Milk and Formula ?Nipple Type: Slow - flow ? ?LATCH Score - baby recently fed - unable assess latch. Per mom baby latching well .  ?  ? ?  ? ?  ? ?  ? ?  ? ?  ? ? ?Lactation Tools Discussed/Used ?Breast pump type: Double-Electric Breast Pump;Other (comment) (pump already set up) ?Pump Education: Milk Storage ? ?Interventions ?Interventions: Breast feeding basics reviewed;DEBP;Hand pump;Education;LC Services brochure ? ?Discharge ?Discharge Education: Engorgement and breast care;Warning signs for feeding baby;Outpatient recommendation;Outpatient Epic message sent;Other (comment) (mom aware she will be called for appt.) ?Pump: Personal;DEBP ? ?Consult Status ?Consult Status: Complete ?Date: 09/14/21 ? ? ? ?Matilde Sprang Christopher Glasscock ?09/14/2021, 8:27 AM ? ? ? ?

## 2021-09-15 ENCOUNTER — Inpatient Hospital Stay (HOSPITAL_COMMUNITY): Payer: Medicaid Other

## 2021-09-15 ENCOUNTER — Other Ambulatory Visit: Payer: Medicaid Other

## 2021-09-15 ENCOUNTER — Ambulatory Visit: Payer: Medicaid Other

## 2021-09-15 ENCOUNTER — Inpatient Hospital Stay (HOSPITAL_COMMUNITY)
Admission: AD | Admit: 2021-09-15 | Payer: Medicaid Other | Source: Home / Self Care | Admitting: Obstetrics & Gynecology

## 2021-09-22 ENCOUNTER — Telehealth (HOSPITAL_COMMUNITY): Payer: Self-pay | Admitting: *Deleted

## 2021-09-22 NOTE — Telephone Encounter (Signed)
Attempted hospital discharge follow-up call. Left message for patient to return RN call. Erline Levine, RN, 09/22/21, 1911 ?

## 2022-10-27 ENCOUNTER — Encounter: Payer: Self-pay | Admitting: Nurse Practitioner

## 2022-10-27 ENCOUNTER — Other Ambulatory Visit (HOSPITAL_COMMUNITY)
Admission: RE | Admit: 2022-10-27 | Discharge: 2022-10-27 | Disposition: A | Discharge status: Home / Self Care | Payer: Medicaid Other | Source: Ambulatory Visit | Attending: Nurse Practitioner | Admitting: Nurse Practitioner

## 2022-10-27 ENCOUNTER — Ambulatory Visit: Payer: Medicaid Other | Admitting: Nurse Practitioner

## 2022-10-27 VITALS — BP 122/80 | HR 55 | Temp 97.9°F | Ht 65.0 in | Wt 261.0 lb

## 2022-10-27 DIAGNOSIS — Z113 Encounter for screening for infections with a predominantly sexual mode of transmission: Secondary | ICD-10-CM

## 2022-10-27 DIAGNOSIS — Z136 Encounter for screening for cardiovascular disorders: Secondary | ICD-10-CM

## 2022-10-27 DIAGNOSIS — D500 Iron deficiency anemia secondary to blood loss (chronic): Secondary | ICD-10-CM

## 2022-10-27 DIAGNOSIS — Z0000 Encounter for general adult medical examination without abnormal findings: Secondary | ICD-10-CM

## 2022-10-27 LAB — COMPREHENSIVE METABOLIC PANEL
ALT: 13 U/L (ref 0–35)
AST: 17 U/L (ref 0–37)
Albumin: 3.7 g/dL (ref 3.5–5.2)
Alkaline Phosphatase: 103 U/L (ref 39–117)
BUN: 11 mg/dL (ref 6–23)
CO2: 25 mEq/L (ref 19–32)
Calcium: 8.8 mg/dL (ref 8.4–10.5)
Chloride: 108 mEq/L (ref 96–112)
Creatinine, Ser: 0.91 mg/dL (ref 0.40–1.20)
GFR: 86.18 mL/min (ref >60.00)
Glucose, Bld: 86 mg/dL (ref 70–99)
Potassium: 4.2 mEq/L (ref 3.5–5.1)
Sodium: 141 mEq/L (ref 135–145)
Total Bilirubin: 0.3 mg/dL (ref 0.2–1.2)
Total Protein: 6.9 g/dL (ref 6.0–8.3)

## 2022-10-27 LAB — CBC
HCT: 30.2 % — ABNORMAL LOW (ref 36.0–46.0)
Hemoglobin: 9.2 g/dL — ABNORMAL LOW (ref 12.0–15.0)
MCHC: 30.4 g/dL (ref 30.0–36.0)
MCV: 65.6 fl — ABNORMAL LOW (ref 78.0–100.0)
Platelets: 227 10*3/uL (ref 150.0–400.0)
RBC: 4.61 Mil/uL (ref 3.87–5.11)
RDW: 18.1 % — ABNORMAL HIGH (ref 11.5–15.5)
WBC: 5 10*3/uL (ref 4.0–10.5)

## 2022-10-27 LAB — LIPID PANEL
Cholesterol: 133 mg/dL (ref 0–200)
HDL: 37.5 mg/dL — ABNORMAL LOW (ref >39.00)
LDL Cholesterol: 82 mg/dL (ref 0–99)
NonHDL: 95.59
Total CHOL/HDL Ratio: 4
Triglycerides: 66 mg/dL (ref 0.0–149.0)
VLDL: 13.2 mg/dL (ref 0.0–40.0)

## 2022-10-27 NOTE — Progress Notes (Addendum)
New Patient Visit  BP 122/80 (BP Location: Right Arm)   Pulse (!) 55   Temp 97.9 F (36.6 C)   Ht 5\' 5"  (1.651 m)   Wt 261 lb (118.4 kg)   SpO2 99%   Breastfeeding No   BMI 43.43 kg/m    Subjective:    Patient ID: Jasmine Myers, female    DOB: 14-Jun-1995, 27 y.o.   MRN: [id number]  CC: Chief Complaint  Patient presents with   Establish Care    NP. EST Care, overall health check up    HPI: Jasmine Myers is a 27 y.o. female presents for new patient visit to establish care.  Introduced to Publishing rights manager role and practice setting.  All questions answered.  Discussed provider/patient relationship and expectations.  She is looking to establish care and has no concerns today.   Depression and Anxiety Screen done:     10/27/2022    9:21 AM  Depression screen PHQ 2/9  Decreased Interest 1  Down, Depressed, Hopeless 0  PHQ - 2 Score 1  Altered sleeping 0  Tired, decreased energy 1  Change in appetite 1  Feeling bad or failure about yourself  0  Trouble concentrating 0  Moving slowly or fidgety/restless 0  Suicidal thoughts 0  PHQ-9 Score 3  Difficult doing work/chores Not difficult at all      10/27/2022    9:21 AM  GAD 7 : Generalized Anxiety Score  Nervous, Anxious, on Edge 0  Control/stop worrying 0  Worry too much - different things 0  Trouble relaxing 1  Restless 0  Easily annoyed or irritable 0  Afraid - awful might happen 0  Total GAD 7 Score 1  Anxiety Difficulty Not difficult at all    Past Medical History:  Diagnosis Date   Chlamydia infection 07/29/2011   Medical history non-contributory     Past Surgical History:  Procedure Laterality Date   NO PAST SURGERIES      Family History  Problem Relation Age of Onset   Arthritis/Rheumatoid Mother    ADD / ADHD Sister    ADD / ADHD Brother    Diabetes Maternal Aunt    Asthma Maternal Aunt    Hypertension Maternal Uncle    Cancer Maternal Uncle        prostate   Cancer Maternal Grandmother         lung - smoker     Social History   Tobacco Use   Smoking status: Never   Smokeless tobacco: Never  Vaping Use   Vaping Use: Never used  Substance Use Topics   Alcohol use: No   Drug use: No    Current Outpatient Medications on File Prior to Visit  Medication Sig Dispense Refill   etonogestrel (NEXPLANON) 68 MG IMPL implant 1 each by Subdermal route once.     No current facility-administered medications on file prior to visit.     Review of Systems  Constitutional:  Positive for fatigue. Negative for fever.  HENT: Negative.    Respiratory: Negative.    Cardiovascular: Negative.   Gastrointestinal: Negative.   Genitourinary: Negative.   Musculoskeletal:  Positive for arthralgias (knees).  Skin: Negative.   Neurological: Negative.   Psychiatric/Behavioral: Negative.        Objective:    BP 122/80 (BP Location: Right Arm)   Pulse (!) 55   Temp 97.9 F (36.6 C)   Ht 5\' 5"  (1.651 m)   Wt 261 lb (118.4 kg)  SpO2 99%   Breastfeeding No   BMI 43.43 kg/m   Wt Readings from Last 3 Encounters:  10/27/22 261 lb (118.4 kg)  09/12/21 241 lb 6.5 oz (109.5 kg)  07/08/18 249 lb 8 oz (113.2 kg)    BP Readings from Last 3 Encounters:  10/27/22 122/80  09/14/21 112/66  08/31/21 (!) 110/54    Physical Exam Vitals and nursing note reviewed.  Constitutional:      General: She is not in acute distress.    Appearance: Normal appearance. She is obese.  HENT:     Head: Normocephalic and atraumatic.     Right Ear: Tympanic membrane, ear canal and external ear normal.     Left Ear: Tympanic membrane, ear canal and external ear normal.  Eyes:     Conjunctiva/sclera: Conjunctivae normal.  Cardiovascular:     Rate and Rhythm: Normal rate and regular rhythm.     Pulses: Normal pulses.     Heart sounds: Normal heart sounds.  Pulmonary:     Effort: Pulmonary effort is normal.     Breath sounds: Normal breath sounds.  Abdominal:     Palpations: Abdomen is soft.      Tenderness: There is no abdominal tenderness.  Musculoskeletal:        General: Normal range of motion.     Cervical back: Normal range of motion and neck supple.     Right lower leg: No edema.     Left lower leg: No edema.  Lymphadenopathy:     Cervical: No cervical adenopathy.  Skin:    General: Skin is warm and dry.  Neurological:     General: No focal deficit present.     Mental Status: She is alert and oriented to person, place, and time.     Cranial Nerves: No cranial nerve deficit.     Coordination: Coordination normal.     Gait: Gait normal.  Psychiatric:        Mood and Affect: Mood normal.        Behavior: Behavior normal.        Thought Content: Thought content normal.        Judgment: Judgment normal.       Assessment & Plan:   Problem List Items Addressed This Visit       Other   Morbid obesity (HCC)    BMI 43.4. Dicussed nutrition and exercise. Also discussed referrals to nutritionist and healthy weight and wellness if interested.       IDA (iron deficiency anemia)    She has taken iron supplements in the past. Check CBC and iron panel today and treat based on results.       Relevant Orders   CBC   Iron, TIBC and Ferritin Panel   Routine general medical examination at a health care facility - Primary    Health maintenance reviewed and updated. Discussed nutrition, exercise. Check CMP, CBC today. Follow-up 1 year.        Relevant Orders   CBC   Comprehensive metabolic panel   Other Visit Diagnoses     Screening for cardiovascular condition       Screen baseline lipid panel today   Relevant Orders   Lipid panel   Screen for STD (sexually transmitted disease)       Screen STDs per patient request   Relevant Orders   HIV Antibody (routine testing w rflx)   RPR   Urine cytology ancillary only  LABORATORY TESTING:  - Pap smear: done elsewhere  IMMUNIZATIONS:   - Tdap: Tetanus vaccination status reviewed: last tetanus booster within 10  years. - Influenza: Postponed to flu season - Pneumovax: Not applicable - Prevnar: Not applicable - HPV: Up to date - Zostavax vaccine: Not applicable  SCREENING: -Mammogram: Not applicable  - Colonoscopy: Not applicable  - Bone Density: Not applicable  -Hearing Test: Not applicable  -Spirometry: Not applicable   PATIENT COUNSELING:   Advised to take 1 mg of folate supplement per day if capable of pregnancy.   Sexuality: Discussed sexually transmitted diseases, partner selection, use of condoms, avoidance of unintended pregnancy  and contraceptive alternatives.   Advised to avoid cigarette smoking.  I discussed with the patient that most people either abstain from alcohol or drink within safe limits (<=14/week and <=4 drinks/occasion for males, <=7/weeks and <= 3 drinks/occasion for females) and that the risk for alcohol disorders and other health effects rises proportionally with the number of drinks per week and how often a drinker exceeds daily limits.  Discussed cessation/primary prevention of drug use and availability of treatment for abuse.   Diet: Encouraged to adjust caloric intake to maintain  or achieve ideal body weight, to reduce intake of dietary saturated fat and total fat, to limit sodium intake by avoiding high sodium foods and not adding table salt, and to maintain adequate dietary potassium and calcium preferably from fresh fruits, vegetables, and low-fat dairy products.    stressed the importance of regular exercise  Injury prevention: Discussed safety belts, safety helmets, smoke detector, smoking near bedding or upholstery.   Dental health: Discussed importance of regular tooth brushing, flossing, and dental visits.    NEXT PREVENTATIVE PHYSICAL DUE IN 1 YEAR.   Follow up plan: Return in about 1 year (around 10/27/2023) for CPE.

## 2022-10-27 NOTE — Assessment & Plan Note (Signed)
Health maintenance reviewed and updated. Discussed nutrition, exercise. Check CMP, CBC today. Follow-up 1 year.   

## 2022-10-27 NOTE — Assessment & Plan Note (Addendum)
She has taken iron supplements in the past. Check CBC and iron panel today and treat based on results.

## 2022-10-27 NOTE — Patient Instructions (Addendum)
It was great to see you!  We are checking your labs today and will let you know the results via mychart/phone.   Let me know if you would like a referral to a nutritionist. You can also look into [facility].   Let's follow-up in 1 year, sooner if you have concerns.  If a referral was placed today, you will be contacted for an appointment. Please note that routine referrals can sometimes take up to 3-4 weeks to process. Please call our office if you haven't heard anything after this time frame.  Take care,  Rodman Pickle, NP

## 2022-10-27 NOTE — Assessment & Plan Note (Signed)
BMI 43.4. Dicussed nutrition and exercise. Also discussed referrals to nutritionist and healthy weight and wellness if interested.

## 2022-10-28 LAB — IRON,TIBC AND FERRITIN PANEL
%SAT: 4 % (calc) — ABNORMAL LOW (ref 16–45)
Ferritin: 3 ng/mL — ABNORMAL LOW (ref 16–154)
Iron: 15 ug/dL — ABNORMAL LOW (ref 40–190)
TIBC: 366 mcg/dL (calc) (ref 250–450)

## 2022-10-28 LAB — URINE CYTOLOGY ANCILLARY ONLY
Chlamydia: NEGATIVE
Comment: NEGATIVE
Comment: NORMAL
Neisseria Gonorrhea: NEGATIVE

## 2022-10-28 LAB — RPR: RPR Ser Ql: NONREACTIVE

## 2022-10-28 LAB — HIV ANTIBODY (ROUTINE TESTING W REFLEX): HIV 1&2 Ab, 4th Generation: NONREACTIVE

## 2022-12-31 ENCOUNTER — Ambulatory Visit
Admission: RE | Admit: 2022-12-31 | Discharge: 2022-12-31 | Disposition: A | Discharge status: Home / Self Care | Payer: Medicaid Other | Source: Ambulatory Visit | Attending: Urgent Care | Admitting: Urgent Care

## 2022-12-31 VITALS — BP 113/74 | HR 71 | Temp 98.8°F | Resp 18

## 2022-12-31 DIAGNOSIS — B3731 Acute candidiasis of vulva and vagina: Secondary | ICD-10-CM | POA: Diagnosis not present

## 2022-12-31 MED ORDER — FLUCONAZOLE 150 MG PO TABS: ORAL_TABLET | ORAL | 0 refills | Status: DC

## 2022-12-31 NOTE — ED Triage Notes (Signed)
Pt reports vaginal itching x 2 days.

## 2022-12-31 NOTE — Discharge Instructions (Signed)
You appear to have a yeast vaginitis. Please take 1 tablet of Diflucan today. If your symptoms persist, you may repeat a second tablet in 72 to 96 hours.  We have sent in a swab to test for additional possible causes of your symptoms.  We will only contact you if the results are positive and require change in treatment.  The results will also be available on MyChart.

## 2022-12-31 NOTE — ED Provider Notes (Signed)
[facility]    CSN: [id number] Arrival date & time: 12/31/22  0850      History   Chief Complaint Chief Complaint  Patient presents with   Vaginal Itching    Entered by patient    HPI Jasmine Myers is a 27 y.o. female.   27 year old female presents to due to concerns of vaginal itching.  States has been present for the past 2 to 3 days.  She states it feels like it primarily affects the external labia minora.  She denies any intense internal pruritus.  She does report a white discharge, but states it is more sticky and thick than clumpy.  Denies any exposure to an STD.  No pelvic pain, odor, rash, lesions.  No dysuria hematuria frequency or flank pain.  No fever.  She is not diabetic.  No changes to medications no recent antibiotics, no changes to soaps.  Patient does not douche.  No over-the-counter treatments have been tried.   Vaginal Itching    Past Medical History:  Diagnosis Date   Chlamydia infection 07/29/2011   Medical history non-contributory     Patient Active Problem List   Diagnosis Date Noted   Routine general medical examination at a health care facility 10/27/2022   IDA (iron deficiency anemia) 09/12/2021   Morbid obesity (HCC) 11/20/2017    Past Surgical History:  Procedure Laterality Date   NO PAST SURGERIES      OB History     Gravida  3   Para  3   Term  3   Preterm      AB      Living  3      SAB      IAB      Ectopic      Multiple  0   Live Births  3            Home Medications    Prior to Admission medications   Medication Sig Start Date End Date Taking? Authorizing Provider  fluconazole (DIFLUCAN) 150 MG tablet Take one tab PO x 1 dose now. May repeat in 72-96 hours as needed 12/31/22  Yes Shaleen Talamantez L, PA  etonogestrel (NEXPLANON) 68 MG IMPL implant 1 each by Subdermal route once.    [provider]    Family History Family History  Problem Relation Age of Onset   Arthritis/Rheumatoid  Mother    ADD / ADHD Sister    ADD / ADHD Brother    Diabetes Maternal Aunt    Asthma Maternal Aunt    Hypertension Maternal Uncle    Cancer Maternal Uncle        prostate   Cancer Maternal Grandmother        lung - smoker    Social History Social History   Tobacco Use   Smoking status: Never   Smokeless tobacco: Never  Vaping Use   Vaping status: Never Used  Substance Use Topics   Alcohol use: Yes    Comment: socially   Drug use: Never     Allergies   Patient has no known allergies.   Review of Systems Review of Systems As per HPI  Physical Exam Triage Vital Signs ED Triage Vitals  Encounter Vitals Group     BP 12/31/22 0945 113/74     Systolic BP Percentile --      Diastolic BP Percentile --      Pulse Rate 12/31/22 0945 71     Resp 12/31/22 0945  18     Temp 12/31/22 0945 98.8 F (37.1 C)     Temp Source 12/31/22 0945 Oral     SpO2 12/31/22 0945 99 %     Weight --      Height --      Head Circumference --      Peak Flow --      Pain Score 12/31/22 0955 0     Pain Loc --      Pain Education --      Exclude from Growth Chart --    No data found.  Updated Vital Signs BP 113/74 (BP Location: Right Arm)   Pulse 71   Temp 98.8 F (37.1 C) (Oral)   Resp 18   SpO2 99%   Breastfeeding No   Visual Acuity Right Eye Distance:   Left Eye Distance:   Bilateral Distance:    Right Eye Near:   Left Eye Near:    Bilateral Near:     Physical Exam Vitals and nursing note reviewed. Chaperone present: deferred.  Constitutional:      General: She is not in acute distress.    Appearance: Normal appearance. She is normal weight. She is not ill-appearing, toxic-appearing or diaphoretic.  HENT:     Head: Normocephalic and atraumatic.  Eyes:     Pupils: Pupils are equal, round, and reactive to light.  Cardiovascular:     Rate and Rhythm: Normal rate.     Pulses: Normal pulses.     Heart sounds: Normal heart sounds. No murmur heard.    No friction rub.  No gallop.  Pulmonary:     Effort: Pulmonary effort is normal. No respiratory distress.     Breath sounds: Normal breath sounds. No wheezing or rales.  Chest:     Chest wall: No tenderness.  Abdominal:     General: Abdomen is flat. Bowel sounds are normal. There is no distension.     Palpations: Abdomen is soft. There is no mass.     Tenderness: There is no abdominal tenderness. There is no right CVA tenderness, left CVA tenderness, guarding or rebound.     Hernia: No hernia is present.  Genitourinary:    Pubic Area: No rash.      Labia:        Right: No lesion.        Left: No lesion.      Urethra: No prolapse, urethral pain, urethral swelling or urethral lesion.     Vagina: Vaginal discharge and erythema present.     Adnexa:        Right: No tenderness.         Left: No tenderness.       Comments: Irritation and erythema with thick white discharge to vaginal introitus Musculoskeletal:     Right lower leg: No edema.     Left lower leg: No edema.  Lymphadenopathy:     Lower Body: No right inguinal adenopathy. No left inguinal adenopathy.  Skin:    General: Skin is warm.     Findings: No bruising, erythema or rash.  Neurological:     General: No focal deficit present.     Mental Status: She is alert. Mental status is at baseline.  Psychiatric:        Mood and Affect: Mood normal.      UC Treatments / Results  Labs (all labs ordered are listed, but only abnormal results are displayed) Labs Reviewed  CERVICOVAGINAL ANCILLARY ONLY  EKG   Radiology No results found.  Procedures Procedures (including critical care time)  Medications Ordered in UC Medications - No data to display  Initial Impression / Assessment and Plan / UC Course  I have reviewed the triage vital signs and the nursing notes.  Pertinent labs & imaging results that were available during my care of the patient were reviewed by me and considered in my medical decision making (see chart for  details).     Yeast vaginitis - diflucan x 1 today. May repeat in 72 hours if needed. Aptima swab collected. Avoid intercourse until results obtained.   Final Clinical Impressions(s) / UC Diagnoses   Final diagnoses:  Yeast vaginitis     Discharge Instructions      You appear to have a yeast vaginitis. Please take 1 tablet of Diflucan today. If your symptoms persist, you may repeat a second tablet in 72 to 96 hours.  We have sent in a swab to test for additional possible causes of your symptoms.  We will only contact you if the results are positive and require change in treatment.  The results will also be available on MyChart.     ED Prescriptions     Medication Sig Dispense Auth. Provider   fluconazole (DIFLUCAN) 150 MG tablet Take one tab PO x 1 dose now. May repeat in 72-96 hours as needed 2 tablet Raelle Chambers, [place] L, [place]      PDMP not reviewed this encounter.   Maretta Bees, [place] 01/01/23 [phone]

## 2023-01-02 LAB — CERVICOVAGINAL ANCILLARY ONLY
Bacterial Vaginitis (gardnerella): NEGATIVE
Candida Glabrata: NEGATIVE
Candida Vaginitis: POSITIVE — AB
Chlamydia: NEGATIVE
Comment: NEGATIVE
Comment: NEGATIVE
Comment: NEGATIVE
Comment: NEGATIVE
Comment: NEGATIVE
Comment: NORMAL
Neisseria Gonorrhea: NEGATIVE
Trichomonas: NEGATIVE

## 2023-01-11 ENCOUNTER — Encounter (INDEPENDENT_AMBULATORY_CARE_PROVIDER_SITE_OTHER): Payer: Self-pay

## 2023-01-25 ENCOUNTER — Ambulatory Visit: Payer: Medicaid Other | Admitting: Nurse Practitioner

## 2023-01-25 VITALS — BP 106/68 | HR 84 | Temp 97.0°F | Ht 65.0 in | Wt 266.2 lb

## 2023-01-25 DIAGNOSIS — Z6841 Body Mass Index (BMI) 40.0 and over, adult: Secondary | ICD-10-CM | POA: Diagnosis not present

## 2023-01-25 DIAGNOSIS — D500 Iron deficiency anemia secondary to blood loss (chronic): Secondary | ICD-10-CM | POA: Diagnosis not present

## 2023-01-25 DIAGNOSIS — M25562 Pain in left knee: Secondary | ICD-10-CM

## 2023-01-25 MED ORDER — POLYSACCHARIDE IRON COMPLEX 150 MG PO CAPS: 150.0000 mg | ORAL_CAPSULE | ORAL | 1 refills | Status: DC

## 2023-01-25 MED ORDER — NAPROXEN 500 MG PO TABS: 500.0000 mg | ORAL_TABLET | Freq: Two times a day (BID) | ORAL | 0 refills | Status: AC | PRN

## 2023-01-25 MED ORDER — PHENTERMINE HCL 30 MG PO CAPS: 30.0000 mg | ORAL_CAPSULE | ORAL | 0 refills | Status: DC

## 2023-01-25 NOTE — Patient Instructions (Signed)
It was great to see you!  Start using ice on your knee at least twice a day for 20 minutes.   Start naproxen twice a day with food as needed for pain.   You can wear a compression knee sleeve or ACE wrap around your knee during the day.   Start iron every other day.   Start phentermine 1 tablet daily first thing in the morning.   Let's follow-up in 3-4 weeks, sooner if you have concerns.  If a referral was placed today, you will be contacted for an appointment. Please note that routine referrals can sometimes take up to 3-4 weeks to process. Please call our office if you haven't heard anything after this time frame.  Take care,  Rodman Pickle, NP

## 2023-01-25 NOTE — Assessment & Plan Note (Signed)
BMI 44.3.  She is interested in a referral to a nutritionist and would like to start medication.  She states that this is the heaviest that she has ever been.  She has been trying to watch what she eats, however she states she does have a sweet tooth.  We discussed medication weight loss options including phentermine and Wegovy.  She is interested in starting phentermine since that it is by mouth.  PDMP reviewed.  Discussed possible side effects.  Will send in phentermine 30 mg daily.  Follow-up in 4 weeks.

## 2023-01-25 NOTE — Progress Notes (Signed)
Established Patient Office Visit  Subjective   Patient ID: Jasmine Myers, female    DOB: Jul 10, 1995  Age: 27 y.o. MRN: [id number]  Chief Complaint  Patient presents with   Knee Pain    Left knee pain and discuss weight loss options    HPI  Jasmine Myers is here to discuss knee pain and weight management.   Jasmine Myers started having left knee pain 2 weeks ago. Jasmine Myers denies injury. It hurts worse when Jasmine Myers stands on it for [place] periods of time. It also hurts when Jasmine Myers stretches her knee. Jasmine Myers denies swelling and redness.   Jasmine Myers is also wanting to discuss weight loss options. Jasmine Myers states that Jasmine Myers eats well for the most part, but does have a sweet tooth. Jasmine Myers is up and down with the kids frequently, but does not do any formal exercise. Jasmine Myers states that this is the highest weight Jasmine Myers has been. Jasmine Myers denies chest pain and shortness of breath. Jasmine Myers is interested in a referral to a nutritionist and weight loss medication.     ROS See pertinent positives and negatives per HPI.    Objective:     BP 106/68 (BP Location: Right Arm)   Pulse 84   Temp (!) 97 F (36.1 C)   Ht 5\' 5"  (1.651 m)   Wt 266 lb 3.2 oz (120.7 kg)   SpO2 100%   Breastfeeding No   BMI 44.30 kg/m  BP Readings from Last 3 Encounters:  01/25/23 106/68  12/31/22 113/74  10/27/22 122/80   Wt Readings from Last 3 Encounters:  01/25/23 266 lb 3.2 oz (120.7 kg)  10/27/22 261 lb (118.4 kg)  09/12/21 241 lb 6.5 oz (109.5 kg)      Physical Exam Vitals and nursing note reviewed.  Constitutional:      General: Jasmine Myers is not in acute distress.    Appearance: Normal appearance. Jasmine Myers is obese.  HENT:     Head: Normocephalic.  Eyes:     Conjunctiva/sclera: Conjunctivae normal.  Cardiovascular:     Rate and Rhythm: Normal rate and regular rhythm.     Pulses: Normal pulses.     Heart sounds: Normal heart sounds.  Pulmonary:     Effort: Pulmonary effort is normal.     Breath sounds: Normal breath sounds.  Musculoskeletal:         General: No swelling or tenderness. Normal range of motion.     Cervical back: Normal range of motion.     Comments: Negative anterior/posterior drawer test, no instability noted  Skin:    General: Skin is warm.  Neurological:     General: No focal deficit present.     Mental Status: Jasmine Myers is alert and oriented to person, place, and time.  Psychiatric:        Mood and Affect: Mood normal.        Behavior: Behavior normal.        Thought Content: Thought content normal.        Judgment: Judgment normal.      Assessment & Plan:   Problem List Items Addressed This Visit       Other   Morbid obesity (HCC)    BMI 44.3.  Jasmine Myers is interested in a referral to a nutritionist and would like to start medication.  Jasmine Myers states that this is the heaviest that Jasmine Myers has ever been.  Jasmine Myers has been trying to watch what Jasmine Myers eats, however Jasmine Myers states Jasmine Myers does have a sweet tooth.  We discussed  medication weight loss options including phentermine and Wegovy.  Jasmine Myers is interested in starting phentermine since that it is by mouth.  PDMP reviewed.  Discussed possible side effects.  Will send in phentermine 30 mg daily.  Follow-up in 4 weeks.      Relevant Medications   phentermine 30 MG capsule   Other Relevant Orders   Amb ref to Medical Nutrition Therapy-MNT   IDA (iron deficiency anemia)    Chronic, ongoing.  Her most recent ferritin was 3.  Jasmine Myers states that most over-the-counter supplements upset her stomach if Jasmine Myers takes them with or without food.  Jasmine Myers states only thing Jasmine Myers was able to take in the past was iron polysaccharides.  Will send this into the pharmacy to start every other day.      Relevant Medications   iron polysaccharides (NIFEREX) 150 MG capsule   Other Visit Diagnoses     Acute pain of left knee    -  Primary   Start naproxen 500mg  BID prn pain. Wear a compression sleeve during the day and use ice a few times a day for 20 minutes. F/U in 3-4 weeks       Return in about 4 weeks (around  02/22/2023) for 3-4 weeks, knee pain, weight management .    Gerre Scull, NP

## 2023-01-25 NOTE — Assessment & Plan Note (Signed)
Chronic, ongoing.  Her most recent ferritin was 3.  She states that most over-the-counter supplements upset her stomach if she takes them with or without food.  She states only thing she was able to take in the past was iron polysaccharides.  Will send this into the pharmacy to start every other day.

## 2023-01-26 ENCOUNTER — Encounter (INDEPENDENT_AMBULATORY_CARE_PROVIDER_SITE_OTHER): Payer: Self-pay

## 2023-01-29 ENCOUNTER — Ambulatory Visit
Admission: EM | Admit: 2023-01-29 | Discharge: 2023-01-29 | Disposition: A | Discharge status: Home / Self Care | Payer: Medicaid Other | Attending: Internal Medicine | Admitting: Internal Medicine

## 2023-01-29 DIAGNOSIS — N760 Acute vaginitis: Secondary | ICD-10-CM | POA: Diagnosis present

## 2023-01-29 MED ORDER — FLUCONAZOLE 150 MG PO TABS: 150.0000 mg | ORAL_TABLET | ORAL | 0 refills | Status: DC

## 2023-01-29 NOTE — ED Triage Notes (Signed)
Pt c/o vaginal itching and d/c x 2 days-NAD-steady gait

## 2023-01-29 NOTE — ED Provider Notes (Signed)
[facility]  Note:  This document was prepared using Conservation officer, historic buildings and may include unintentional dictation errors.  MRN: [id number] DOB: 1996-01-29  Subjective:   Jasmine Myers is a 27 y.o. female presenting for recurrent vaginal itching. Has history of yeast infection. Last episode as seen through our clinic was 12/31/2022, vaginal cytology confirmed this. Denies fever, n/v, abdominal pain, pelvic pain, rashes, dysuria, urinary frequency, hematuria, vaginal discharge.  Avoid urinary irritants.  She practices good hygiene.  No current facility-administered medications for this encounter.  Current Outpatient Medications:    etonogestrel (NEXPLANON) 68 MG IMPL implant, 1 each by Subdermal route once., Disp: , Rfl:    iron polysaccharides (NIFEREX) 150 MG capsule, Take 1 capsule (150 mg total) by mouth every other day., Disp: 45 capsule, Rfl: 1   naproxen (NAPROSYN) 500 MG tablet, Take 1 tablet (500 mg total) by mouth 2 (two) times daily as needed for mild pain., Disp: 60 tablet, Rfl: 0   phentermine 30 MG capsule, Take 1 capsule (30 mg total) by mouth every morning., Disp: 30 capsule, Rfl: 0   No Known Allergies  Past Medical History:  Diagnosis Date   Chlamydia infection 07/29/2011   Medical history non-contributory      Past Surgical History:  Procedure Laterality Date   NO PAST SURGERIES      Family History  Problem Relation Age of Onset   Arthritis/Rheumatoid Mother    ADD / ADHD Sister    ADD / ADHD Brother    Diabetes Maternal Aunt    Asthma Maternal Aunt    Hypertension Maternal Uncle    Cancer Maternal Uncle        prostate   Cancer Maternal Grandmother        lung - smoker    Social History   Tobacco Use   Smoking status: Never   Smokeless tobacco: Never  Vaping Use   Vaping status: Never Used  Substance Use Topics   Alcohol use: Yes    Comment: socially   Drug use: Never    ROS   Objective:    Vitals: BP 112/82 (BP Location: Right Arm)   Pulse 73   Temp 98.7 F (37.1 C) (Oral)   Resp 18   SpO2 98%   Physical Exam Constitutional:      General: She is not in acute distress.    Appearance: Normal appearance. She is well-developed. She is not ill-appearing, toxic-appearing or diaphoretic.  HENT:     Head: Normocephalic and atraumatic.     Nose: Nose normal.     Mouth/Throat:     Mouth: Mucous membranes are moist.  Eyes:     General: No scleral icterus.       Right eye: No discharge.        Left eye: No discharge.     Extraocular Movements: Extraocular movements intact.  Cardiovascular:     Rate and Rhythm: Normal rate.  Pulmonary:     Effort: Pulmonary effort is normal.  Skin:    General: Skin is warm and dry.  Neurological:     General: No focal deficit present.     Mental Status: She is alert and oriented to person, place, and time.  Psychiatric:        Mood and Affect: Mood normal.        Behavior: Behavior normal.     Assessment and Plan :   PDMP not reviewed this encounter.  1. Acute vaginitis  Will treat for recurrent vaginitis with 5 total doses of fluconazole every 3 days.  Follow-up with gynecology for recheck.  Counseled patient on potential for adverse effects with medications prescribed/recommended today, ER and return-to-clinic precautions discussed, patient verbalized understanding.    Wallis Bamberg, [place] 01/29/23 [phone]

## 2023-01-29 NOTE — Discharge Instructions (Addendum)
Please make sure you follow-up with your gynecologist for recheck on your recurring yeast infection and to consider more Postel-term management for this.  Will let you know about your results tomorrow.

## 2023-01-30 LAB — CERVICOVAGINAL ANCILLARY ONLY
Bacterial Vaginitis (gardnerella): NEGATIVE
Candida Glabrata: NEGATIVE
Candida Vaginitis: POSITIVE — AB
Comment: NEGATIVE
Comment: NEGATIVE
Comment: NEGATIVE

## 2023-02-22 ENCOUNTER — Encounter: Payer: Self-pay | Admitting: Nurse Practitioner

## 2023-02-22 ENCOUNTER — Ambulatory Visit (INDEPENDENT_AMBULATORY_CARE_PROVIDER_SITE_OTHER): Payer: Medicaid Other | Admitting: Nurse Practitioner

## 2023-02-22 VITALS — BP 108/76 | HR 64 | Temp 97.8°F | Ht 65.0 in | Wt 254.0 lb

## 2023-02-22 DIAGNOSIS — Z6841 Body Mass Index (BMI) 40.0 and over, adult: Secondary | ICD-10-CM

## 2023-02-22 DIAGNOSIS — Z23 Encounter for immunization: Secondary | ICD-10-CM | POA: Diagnosis not present

## 2023-02-22 DIAGNOSIS — M25562 Pain in left knee: Secondary | ICD-10-CM | POA: Diagnosis not present

## 2023-02-22 MED ORDER — PHENTERMINE HCL 30 MG PO CAPS: 30.0000 mg | ORAL_CAPSULE | ORAL | 0 refills | Status: DC

## 2023-02-22 NOTE — Assessment & Plan Note (Signed)
She has lost 12 pounds since starting the phentermine, congratulated her on this.  Recommend continue focus on smaller portions and healthy food choices along with exercise.  PDMP reviewed.  Will refill phentermine 30 mg daily for 2 more months.  Follow-up in 2 months.

## 2023-02-22 NOTE — Progress Notes (Signed)
Established Patient Office Visit  Subjective   Patient ID: Jasmine Myers, female    DOB: Apr 05, 1996  Age: 27 y.o. MRN: [id number]  Chief Complaint  Patient presents with   Weight Management Follow up    Follow up, no concerns    HPI  Jasmine Myers is here to follow-up on knee pain and weight management.   She states that her knee is doing a lot better since starting the naproxen twice a day.  She has also been doing the stretches.  She is doing well with the phentermine 30 mg daily.  She states that she did have some heart racing and lightheadedness on the first day that she took it, however this resolved.  She has been making better food choices and not eating as much.  She is wanting to continue with this medication.    ROS See pertinent positives and negatives per HPI.    Objective:     BP 108/76 (BP Location: Right Arm)   Pulse 64   Temp 97.8 F (36.6 C)   Ht 5\' 5"  (1.651 m)   Wt 254 lb (115.2 kg)   SpO2 100%   BMI 42.27 kg/m  BP Readings from Last 3 Encounters:  02/22/23 108/76  01/29/23 112/82  01/25/23 106/68   Wt Readings from Last 3 Encounters:  02/22/23 254 lb (115.2 kg)  01/25/23 266 lb 3.2 oz (120.7 kg)  10/27/22 261 lb (118.4 kg)      Physical Exam Vitals and nursing note reviewed.  Constitutional:      General: She is not in acute distress.    Appearance: Normal appearance.  HENT:     Head: Normocephalic.  Eyes:     Conjunctiva/sclera: Conjunctivae normal.  Cardiovascular:     Rate and Rhythm: Normal rate and regular rhythm.     Pulses: Normal pulses.     Heart sounds: Normal heart sounds.  Pulmonary:     Effort: Pulmonary effort is normal.     Breath sounds: Normal breath sounds.  Musculoskeletal:     Cervical back: Normal range of motion.  Skin:    General: Skin is warm.  Neurological:     General: No focal deficit present.     Mental Status: She is alert and oriented to person, place, and time.  Psychiatric:        Mood and  Affect: Mood normal.        Behavior: Behavior normal.        Thought Content: Thought content normal.        Judgment: Judgment normal.      Assessment & Plan:   Problem List Items Addressed This Visit       Other   Morbid obesity (HCC)    She has lost 12 pounds since starting the phentermine, congratulated her on this.  Recommend continue focus on smaller portions and healthy food choices along with exercise.  PDMP reviewed.  Will refill phentermine 30 mg daily for 2 more months.  Follow-up in 2 months.      Relevant Medications   phentermine 30 MG capsule   Other Visit Diagnoses     Acute pain of left knee    -  Primary   Pain has improved with naproxen. Continue naproxen 500mg  BID prn and doing stretches daily.   Immunization due       Flu vaccine given today   Relevant Orders   Flu vaccine trivalent PF, 6mos and older(Flulaval,Afluria,Fluarix,Fluzone) (Completed)  Return in about 2 weeks (around 03/08/2023) for iron deficiency, weight management .    Gerre Scull, NP

## 2023-02-22 NOTE — Patient Instructions (Signed)
It was great to see you!  Keep taking the iron and phentermine  Keep up the great work   Let's follow-up in 2 months, sooner if you have concerns.  If a referral was placed today, you will be contacted for an appointment. Please note that routine referrals can sometimes take up to 3-4 weeks to process. Please call our office if you haven't heard anything after this time frame.  Take care,  Rodman Pickle, NP

## 2023-03-27 ENCOUNTER — Other Ambulatory Visit: Payer: Self-pay | Admitting: Nurse Practitioner

## 2023-03-28 MED ORDER — PHENTERMINE HCL 30 MG PO CAPS: 30.0000 mg | ORAL_CAPSULE | ORAL | 0 refills | Status: DC

## 2023-03-28 NOTE — Telephone Encounter (Signed)
LVM that Rx refill request approved and sent to pharmacy

## 2023-05-16 ENCOUNTER — Ambulatory Visit: Payer: Medicaid Other | Admitting: Nurse Practitioner

## 2023-05-30 ENCOUNTER — Other Ambulatory Visit (HOSPITAL_COMMUNITY): Payer: Self-pay

## 2023-05-30 ENCOUNTER — Encounter: Payer: Self-pay | Admitting: Nurse Practitioner

## 2023-05-30 ENCOUNTER — Ambulatory Visit: Payer: Medicaid Other | Admitting: Nurse Practitioner

## 2023-05-30 VITALS — BP 106/68 | HR 75 | Temp 97.8°F | Ht 65.0 in | Wt 245.8 lb

## 2023-05-30 DIAGNOSIS — N920 Excessive and frequent menstruation with regular cycle: Secondary | ICD-10-CM | POA: Insufficient documentation

## 2023-05-30 DIAGNOSIS — Z6841 Body Mass Index (BMI) 40.0 and over, adult: Secondary | ICD-10-CM

## 2023-05-30 MED ORDER — IBUPROFEN 600 MG PO TABS: 600.0000 mg | ORAL_TABLET | Freq: Three times a day (TID) | ORAL | 0 refills | Status: DC | PRN

## 2023-05-30 MED ORDER — WEGOVY 0.25 MG/0.5ML ~~LOC~~ SOAJ: 0.2500 mg | SUBCUTANEOUS | 1 refills | Status: DC

## 2023-05-30 MED ORDER — WEGOVY 0.25 MG/0.5ML ~~LOC~~ SOAJ
0.2500 mg | SUBCUTANEOUS | 0 refills | Status: DC
  Filled 2023-05-30: qty 2, 28d supply, fill #0

## 2023-05-30 NOTE — Assessment & Plan Note (Signed)
She has experienced prolonged bleeding episodes since starting Nexplanon, with the most recent episode of bleeding stopping today. She has been using Ibuprofen to manage bleeding. We will prescribe Ibuprofen 600mg  for use during prolonged bleeding episodes and advise her not to take Ibuprofen and Naproxen together.

## 2023-05-30 NOTE — Assessment & Plan Note (Signed)
She has successfully lost 21 pounds since August and has stopped taking Phentermine. She is interested in switching from Phentermine to another weight loss medication. We will start Wegovy 0.25mg . We advise her to monitor for side effects such as constipation, diarrhea, and stomach pain and encourage adequate water intake. We will check in 4 weeks to assess tolerance and effectiveness of Wegovy. Continue to focus on limiting portion sizes and slowly increasing exercise as tolerated.

## 2023-05-30 NOTE — Progress Notes (Signed)
Established Patient Office Visit  Subjective   Patient ID: Jasmine Myers, female    DOB: 1996-04-15  Age: 27 y.o. MRN: [id number]  Chief Complaint  Patient presents with   Weight Management    Follow up and wants to discuss other options    HPI  Discussed the use of AI scribe software for clinical note transcription with the patient, who gave verbal consent to proceed.  History of Present Illness   The patient, with a history of obesity and irregular menstrual bleeding, presents for a follow-up visit. She reports no new health problems and has not needed to take naproxen recently. She expresses a desire to switch from her current weight management medication, phentermine, to a different option. She has not tried any other weight management medications before.   The patient has been focusing on eating smaller portions and has noticed a decrease in her appetite. She often eats only once a day or just breakfast and lunch. She has lost nine pounds since her last visit and a total of 21 pounds since August. She has noticed a difference in how her clothes fit but has not noticed a significant physical change in her appearance.  The patient also reports irregular menstrual bleeding since starting Nexplanon. Initially, she did not have any bleeding, but after about ten months, she began to bleed for extended periods. She has been managing the bleeding with ibuprofen, which has been effective.       ROS See pertinent positives and negatives per HPI.    Objective:     BP 106/68 (BP Location: Right Arm, Patient Position: Sitting)   Pulse 75   Temp 97.8 F (36.6 C)   Ht 5\' 5"  (1.651 m)   Wt 245 lb 12.8 oz (111.5 kg)   LMP 05/11/2023   SpO2 100%   Breastfeeding No   BMI 40.90 kg/m  BP Readings from Last 3 Encounters:  05/30/23 106/68  02/22/23 108/76  01/29/23 112/82   Wt Readings from Last 3 Encounters:  05/30/23 245 lb 12.8 oz (111.5 kg)  02/22/23 254 lb (115.2 kg)  01/25/23  266 lb 3.2 oz (120.7 kg)      Physical Exam Vitals and nursing note reviewed.  Constitutional:      General: She is not in acute distress.    Appearance: Normal appearance.  HENT:     Head: Normocephalic.  Eyes:     Conjunctiva/sclera: Conjunctivae normal.  Cardiovascular:     Rate and Rhythm: Normal rate and regular rhythm.     Pulses: Normal pulses.     Heart sounds: Normal heart sounds.  Pulmonary:     Effort: Pulmonary effort is normal.     Breath sounds: Normal breath sounds.  Musculoskeletal:     Cervical back: Normal range of motion.  Skin:    General: Skin is warm.  Neurological:     General: No focal deficit present.     Mental Status: She is alert and oriented to person, place, and time.  Psychiatric:        Mood and Affect: Mood normal.        Behavior: Behavior normal.        Thought Content: Thought content normal.        Judgment: Judgment normal.      Assessment & Plan:   Problem List Items Addressed This Visit       Other   Morbid obesity (HCC)   She has successfully lost 21 pounds since August  and has stopped taking Phentermine. She is interested in switching from Phentermine to another weight loss medication. We will start [employer] 0.25mg . We advise her to monitor for side effects such as constipation, diarrhea, and stomach pain and encourage adequate water intake. We will check in 4 weeks to assess tolerance and effectiveness of Wegovy. Continue to focus on limiting portion sizes and slowly increasing exercise as tolerated.       Relevant Medications   Semaglutide-Weight Management (WEGOVY) 0.25 MG/0.5ML SOAJ   Menorrhagia with regular cycle - Primary   She has experienced prolonged bleeding episodes since starting Nexplanon, with the most recent episode of bleeding stopping today. She has been using Ibuprofen to manage bleeding. We will prescribe Ibuprofen 600mg  for use during prolonged bleeding episodes and advise her not to take Ibuprofen and  Naproxen together.       Return in about 4 weeks (around 06/27/2023) for weight management .    Gerre Scull, NP

## 2023-05-30 NOTE — Patient Instructions (Signed)
It was great to see you!  Continue focus on nutrition  Start wegovy injection once a week   Make sure you drink plenty of water  You can take miralax as needed for constipation   Let's follow-up in 4 weeks, sooner if you have concerns.  If a referral was placed today, you will be contacted for an appointment. Please note that routine referrals can sometimes take up to 3-4 weeks to process. Please call our office if you haven't heard anything after this time frame.  Take care,  Rodman Pickle, NP

## 2023-05-31 ENCOUNTER — Other Ambulatory Visit (HOSPITAL_COMMUNITY): Payer: Self-pay

## 2023-06-01 ENCOUNTER — Telehealth: Payer: Self-pay

## 2023-06-01 NOTE — Telephone Encounter (Signed)
Can we get a prior auth on Wegovy 0.25mg /0.36ml?

## 2023-06-02 ENCOUNTER — Other Ambulatory Visit (HOSPITAL_COMMUNITY): Payer: Self-pay

## 2023-06-02 ENCOUNTER — Telehealth: Payer: Self-pay

## 2023-06-02 NOTE — Telephone Encounter (Signed)
Pharmacy Patient Advocate Encounter  Received notification from [facility] that Prior Authorization for  Wegovy 0.25mg /0.69ml  has been APPROVED from 06/02/23 to 11/29/23. Spoke to pharmacy to process.Copay is $4.00.    PA #/Case ID/Reference #: [id number]

## 2023-06-02 NOTE — Telephone Encounter (Signed)
Patient notified that [facility] approved with a copay of $4.00. Patient wanted to verify pharmacy that Rx was sent to and it was [facility].

## 2023-06-02 NOTE — Telephone Encounter (Signed)
Pharmacy Patient Advocate Encounter   Received notification from Pt Calls Messages that prior authorization for Wegovy 0.25mg /0.84ml is required/requested.   Insurance verification completed.   The patient is insured through [facility] .   Per test claim: PA required; PA submitted to above mentioned insurance via CoverMyMeds Key/confirmation #/EOC [id number] Status is pending

## 2023-06-05 ENCOUNTER — Other Ambulatory Visit (HOSPITAL_COMMUNITY): Payer: Self-pay

## 2023-06-27 ENCOUNTER — Other Ambulatory Visit (HOSPITAL_COMMUNITY): Payer: Self-pay

## 2023-06-27 ENCOUNTER — Ambulatory Visit: Payer: Medicaid Other | Admitting: Nurse Practitioner

## 2023-06-27 ENCOUNTER — Encounter: Payer: Self-pay | Admitting: Nurse Practitioner

## 2023-06-27 VITALS — BP 116/64 | HR 91 | Temp 96.9°F | Ht 65.0 in | Wt 243.8 lb

## 2023-06-27 DIAGNOSIS — R110 Nausea: Secondary | ICD-10-CM | POA: Diagnosis not present

## 2023-06-27 MED ORDER — WEGOVY 0.5 MG/0.5ML ~~LOC~~ SOAJ
0.5000 mg | SUBCUTANEOUS | 0 refills | Status: DC
  Filled 2023-06-27: qty 2, 28d supply, fill #0

## 2023-06-27 MED ORDER — ONDANSETRON HCL 4 MG PO TABS
4.0000 mg | ORAL_TABLET | Freq: Three times a day (TID) | ORAL | 0 refills | Status: DC | PRN
  Filled 2023-06-27: qty 30, 10d supply, fill #0

## 2023-06-27 NOTE — Progress Notes (Signed)
 Established Patient Office Visit  Subjective   Patient ID: Jasmine Myers, female    DOB: [birth year]  Age: 28 y.o. MRN: [id number]  Chief Complaint  Patient presents with   Weight Management    Follow up    HPI  Discussed the use of AI scribe software for clinical note transcription with the patient, who gave verbal consent to proceed.  History of Present Illness   Shiron, a patient on Wegovy  for weight loss, reports experiencing various side effects since starting the medication. Initially, she experienced nausea, followed by a severe headache the next day. The following week, she had an episode of diarrhea. All these side effects occurred only once and seemed to resolve on their own. Despite these side effects, Essense has noticed a significant weight loss, which she is pleased with. She reports that her weight has dropped from 243 to 238 pounds. Devetta also mentions that she has been drinking plenty of water and has been managing to eat small amounts despite a decreased appetite due to the medication.       ROS See pertinent positives and negatives per HPI.    Objective:     BP 116/64 (BP Location: Right Arm, Patient Position: Sitting, Cuff Size: Normal)   Pulse 91   Temp (!) 96.9 F (36.1 C)   Ht 5' 5 (1.651 m)   Wt 243 lb 12.8 oz (110.6 kg)   LMP 05/11/2023 (Exact Date)   SpO2 100%   Breastfeeding No   BMI 40.57 kg/m  BP Readings from Last 3 Encounters:  06/27/23 116/64  05/30/23 106/68  02/22/23 108/76   Wt Readings from Last 3 Encounters:  06/27/23 243 lb 12.8 oz (110.6 kg)  05/30/23 245 lb 12.8 oz (111.5 kg)  02/22/23 254 lb (115.2 kg)      Physical Exam Vitals and nursing note reviewed.  Constitutional:      General: She is not in acute distress.    Appearance: Normal appearance.  HENT:     Head: Normocephalic.  Eyes:     Conjunctiva/sclera: Conjunctivae normal.  Cardiovascular:     Rate and Rhythm: Normal rate and regular rhythm.     Pulses:  Normal pulses.     Heart sounds: Normal heart sounds.  Pulmonary:     Effort: Pulmonary effort is normal.     Breath sounds: Normal breath sounds.  Musculoskeletal:     Cervical back: Normal range of motion.  Skin:    General: Skin is warm.  Neurological:     General: No focal deficit present.     Mental Status: She is alert and oriented to person, place, and time.  Psychiatric:        Mood and Affect: Mood normal.        Behavior: Behavior normal.        Thought Content: Thought content normal.        Judgment: Judgment normal.       Assessment & Plan:   Problem List Items Addressed This Visit       Other   Morbid obesity (HCC)   BMI 40.5. Weight loss has been achieved since starting Wegovy , though nausea, headache, and diarrhea have been reported. Emphasized the importance of hydration and adjusting medication timing to reduce side effects. Increase Wegovy  to 0.5mg  injection weekly. Consider taking it at night to potentially lessen side effects. Continue hydration. Prescribe Zofran  as needed for nausea. Follow up in 2 months to assess weight loss progress and tolerance of  the increased Wegovy  dose. Inform the provider if ready to increase the dose further.        Relevant Medications   Semaglutide -Weight Management (WEGOVY ) 0.5 MG/0.5ML SOAJ   Other Visit Diagnoses       Nausea    -  Primary   Encourage fluids and eating small meals regularly. Will start zofran  4mg  q8 hr prn.       Return in about 2 months (around 08/25/2023) for weight management .    Tinnie DELENA Harada, NP

## 2023-06-27 NOTE — Patient Instructions (Addendum)
 It was great to see you!  Let's increase your Wegovy  to 0.5mg  injection weekly   Keep up the great work!   Start zofran  every 8 hours as needed for nausea   Let's follow-up in 2 months, sooner if you have concerns.  If a referral was placed today, you will be contacted for an appointment. Please note that routine referrals can sometimes take up to 3-4 weeks to process. Please call our office if you haven't heard anything after this time frame.  Take care,  Tinnie Harada, NP

## 2023-06-27 NOTE — Assessment & Plan Note (Signed)
 BMI 40.5. Weight loss has been achieved since starting Wegovy , though nausea, headache, and diarrhea have been reported. Emphasized the importance of hydration and adjusting medication timing to reduce side effects. Increase Wegovy  to 0.5mg  injection weekly. Consider taking it at night to potentially lessen side effects. Continue hydration. Prescribe Zofran  as needed for nausea. Follow up in 2 months to assess weight loss progress and tolerance of the increased Wegovy  dose. Inform the provider if ready to increase the dose further.

## 2023-07-20 ENCOUNTER — Other Ambulatory Visit: Payer: Self-pay | Admitting: Nurse Practitioner

## 2023-07-21 ENCOUNTER — Other Ambulatory Visit (HOSPITAL_COMMUNITY): Payer: Self-pay

## 2023-07-21 MED ORDER — WEGOVY 0.5 MG/0.5ML ~~LOC~~ SOAJ
0.5000 mg | SUBCUTANEOUS | 0 refills | Status: DC
  Filled 2023-07-21: qty 2, 28d supply, fill #0

## 2023-07-21 NOTE — Telephone Encounter (Signed)
 Requesting: Semaglutide -Weight Management (WEGOVY ) 0.5 MG/0.5ML SOAJ  Last Visit: 06/27/2023 Next Visit: 09/12/2023 Last Refill: 06/27/2023  Please Advise   Patient comment: I want to stay at this dose and not go up.

## 2023-08-29 ENCOUNTER — Other Ambulatory Visit: Payer: Self-pay | Admitting: Nurse Practitioner

## 2023-08-29 ENCOUNTER — Other Ambulatory Visit (HOSPITAL_COMMUNITY): Payer: Self-pay

## 2023-08-29 MED ORDER — WEGOVY 1 MG/0.5ML ~~LOC~~ SOAJ
1.0000 mg | SUBCUTANEOUS | 0 refills | Status: DC
  Filled 2023-08-29: qty 2, 28d supply, fill #0

## 2023-08-29 NOTE — Telephone Encounter (Signed)
 Requesting: Semaglutide-Weight Management (WEGOVY) 0.5 MG/0.5ML SOAJ  Last Visit: 06/27/2023 Next Visit: 09/12/2023 Last Refill: 07/21/2023 requesting to go up in dose  Please Advise

## 2023-09-05 ENCOUNTER — Ambulatory Visit: Payer: Medicaid Other | Admitting: Nurse Practitioner

## 2023-09-12 ENCOUNTER — Ambulatory Visit: Payer: Medicaid Other | Admitting: Nurse Practitioner

## 2023-09-12 ENCOUNTER — Other Ambulatory Visit (HOSPITAL_COMMUNITY): Payer: Self-pay

## 2023-09-12 ENCOUNTER — Encounter: Payer: Self-pay | Admitting: Nurse Practitioner

## 2023-09-12 VITALS — BP 108/68 | HR 82 | Temp 97.8°F | Ht 65.0 in | Wt 235.0 lb

## 2023-09-12 DIAGNOSIS — R058 Other specified cough: Secondary | ICD-10-CM | POA: Diagnosis not present

## 2023-09-12 DIAGNOSIS — E669 Obesity, unspecified: Secondary | ICD-10-CM | POA: Diagnosis not present

## 2023-09-12 MED ORDER — WEGOVY 1.7 MG/0.75ML ~~LOC~~ SOAJ
1.7000 mg | SUBCUTANEOUS | 2 refills | Status: DC
  Filled 2023-09-12 – 2023-09-28 (×2): qty 3, 28d supply, fill #0
  Filled 2023-10-31: qty 3, 28d supply, fill #1
  Filled 2023-12-02 – 2023-12-04 (×2): qty 3, 28d supply, fill #2

## 2023-09-12 MED ORDER — PROMETHAZINE-DM 6.25-15 MG/5ML PO SYRP: 5.0000 mL | ORAL_SOLUTION | Freq: Four times a day (QID) | ORAL | 0 refills | Status: DC | PRN

## 2023-09-12 MED ORDER — IBUPROFEN 600 MG PO TABS: 600.0000 mg | ORAL_TABLET | Freq: Three times a day (TID) | ORAL | 0 refills | Status: DC | PRN

## 2023-09-12 NOTE — Assessment & Plan Note (Signed)
 She is on [facility] and has lost eight pounds. Will increase the dose to 1.7 mg in three weeks if tolerated and weight loss continues, with a potential further increase to 2.4 mg if weight loss plateaus or hunger persists. Encourage continued exercise, including walking and jogging.

## 2023-09-12 NOTE — Patient Instructions (Signed)
 It was great to see you!  Keep up the great work!   Let's increase your wegovy in 3 weeks to 1.7mg    Start cough syrup every 4 hours as needed - this may make you sleepy  Let's follow-up in 3 months, sooner if you have concerns.  If a referral was placed today, you will be contacted for an appointment. Please note that routine referrals can sometimes take up to 3-4 weeks to process. Please call our office if you haven't heard anything after this time frame.  Take care,  Rodman Pickle, NP

## 2023-09-12 NOTE — Progress Notes (Signed)
 Established Patient Office Visit  Subjective   Patient ID: Jasmine Myers, female    DOB: [birth year]  Age: 28 y.o. MRN: [id number]  Chief Complaint  Patient presents with   Weight Management    Follow up, concerns with cough and voice loss    HPI Discussed the use of AI scribe software for clinical note transcription with the patient, who gave verbal consent to proceed.  History of Present Illness   The patient, with a history of obesity managed with Wegovy, presents with a persistent cough and voice loss. The symptoms began a week ago with congestion, cough, and headache. The congestion and headache have since resolved, but the cough persists, particularly at night. The cough is dry, but becomes productive in the morning. The patient has been using over-the-counter Sudafed and Theraflu without relief. She has been exposed to sick children at home. The patient denies shortness of breath, wheezing, and ear pain. She has not had a COVID test.  The patient's obesity is well managed with [facility], and she has lost eight pounds since the last visit. She has been exercising three days a week for 30-40 minutes on a walking pad. She is currently on a dose of 1mg  of Wegovy.       ROS See pertinent positives and negatives per HPI.    Objective:     BP 108/68 (BP Location: Right Arm, Patient Position: Sitting, Cuff Size: Normal)   Pulse 82   Temp 97.8 F (36.6 C)   Ht 5\' 5"  (1.651 m)   Wt 235 lb (106.6 kg)   LMP 07/19/2023 (Exact Date)   SpO2 100%   Breastfeeding No   BMI 39.11 kg/m  BP Readings from Last 3 Encounters:  09/12/23 108/68  06/27/23 116/64  05/30/23 106/68   Wt Readings from Last 3 Encounters:  09/12/23 235 lb (106.6 kg)  06/27/23 243 lb 12.8 oz (110.6 kg)  05/30/23 245 lb 12.8 oz (111.5 kg)      Physical Exam Vitals and nursing note reviewed.  Constitutional:      General: She is not in acute distress.    Appearance: Normal appearance.  HENT:     Head:  Normocephalic.     Right Ear: Tympanic membrane, ear canal and external ear normal.     Left Ear: Tympanic membrane, ear canal and external ear normal.     Mouth/Throat:     Mouth: Mucous membranes are moist.     Pharynx: No posterior oropharyngeal erythema.  Eyes:     Conjunctiva/sclera: Conjunctivae normal.  Cardiovascular:     Rate and Rhythm: Normal rate and regular rhythm.     Pulses: Normal pulses.     Heart sounds: Normal heart sounds.  Pulmonary:     Effort: Pulmonary effort is normal.     Breath sounds: Normal breath sounds.  Musculoskeletal:     Cervical back: Normal range of motion and neck supple. No tenderness.  Lymphadenopathy:     Cervical: No cervical adenopathy.  Skin:    General: Skin is warm.  Neurological:     General: No focal deficit present.     Mental Status: She is alert and oriented to person, place, and time.  Psychiatric:        Mood and Affect: Mood normal.        Behavior: Behavior normal.        Thought Content: Thought content normal.        Judgment: Judgment normal.  Assessment & Plan:   Problem List Items Addressed This Visit       Other   Obesity (BMI 30-39.9)   She is on [facility] and has lost eight pounds. Will increase the dose to 1.7 mg in three weeks if tolerated and weight loss continues, with a potential further increase to 2.4 mg if weight loss plateaus or hunger persists. Encourage continued exercise, including walking and jogging.       Relevant Medications   Semaglutide-Weight Management (WEGOVY) 1.7 MG/0.75ML SOAJ   Other Visit Diagnoses       Post-viral cough syndrome    -  Primary   start promethazine dm q4hr prn cough. May cause drowsniess. Continue Theraflu tea for symptomatic relief. Advise resting her voice and minimizing talking.      Return in about 3 months (around 12/12/2023) for CPE.    Gerre Scull, NP

## 2023-09-16 ENCOUNTER — Other Ambulatory Visit (HOSPITAL_COMMUNITY): Payer: Self-pay

## 2023-09-17 ENCOUNTER — Encounter: Payer: Self-pay | Admitting: Nurse Practitioner

## 2023-09-18 MED ORDER — PREDNISONE 20 MG PO TABS: 40.0000 mg | ORAL_TABLET | Freq: Every day | ORAL | 0 refills | Status: DC

## 2023-09-18 MED ORDER — BENZONATATE 100 MG PO CAPS: 100.0000 mg | ORAL_CAPSULE | Freq: Three times a day (TID) | ORAL | 0 refills | Status: DC | PRN

## 2023-09-29 ENCOUNTER — Other Ambulatory Visit (HOSPITAL_COMMUNITY): Payer: Self-pay

## 2023-10-17 ENCOUNTER — Encounter: Payer: Self-pay | Admitting: Nurse Practitioner

## 2023-10-18 MED ORDER — IBUPROFEN 800 MG PO TABS: 800.0000 mg | ORAL_TABLET | Freq: Three times a day (TID) | ORAL | 1 refills | Status: AC | PRN

## 2023-10-30 ENCOUNTER — Ambulatory Visit (INDEPENDENT_AMBULATORY_CARE_PROVIDER_SITE_OTHER): Payer: Medicaid Other | Admitting: Nurse Practitioner

## 2023-10-30 ENCOUNTER — Encounter: Payer: Self-pay | Admitting: Nurse Practitioner

## 2023-10-30 VITALS — BP 116/78 | HR 66 | Temp 96.7°F | Ht 65.0 in | Wt 224.2 lb

## 2023-10-30 DIAGNOSIS — Z0000 Encounter for general adult medical examination without abnormal findings: Secondary | ICD-10-CM | POA: Diagnosis not present

## 2023-10-30 DIAGNOSIS — E669 Obesity, unspecified: Secondary | ICD-10-CM

## 2023-10-30 DIAGNOSIS — D500 Iron deficiency anemia secondary to blood loss (chronic): Secondary | ICD-10-CM | POA: Diagnosis not present

## 2023-10-30 LAB — CBC WITH DIFFERENTIAL/PLATELET
Basophils Absolute: 0 10*3/uL (ref 0.0–0.1)
Basophils Relative: 0.7 % (ref 0.0–3.0)
Eosinophils Absolute: 0.1 10*3/uL (ref 0.0–0.7)
Eosinophils Relative: 2.4 % (ref 0.0–5.0)
HCT: 37.2 % (ref 36.0–46.0)
Hemoglobin: 12 g/dL (ref 12.0–15.0)
Lymphocytes Relative: 34.2 % (ref 12.0–46.0)
Lymphs Abs: 1.7 10*3/uL (ref 0.7–4.0)
MCHC: 32.4 g/dL (ref 30.0–36.0)
MCV: 77.7 fl — ABNORMAL LOW (ref 78.0–100.0)
Monocytes Absolute: 0.4 10*3/uL (ref 0.1–1.0)
Monocytes Relative: 8.8 % (ref 3.0–12.0)
Neutro Abs: 2.7 10*3/uL (ref 1.4–7.7)
Neutrophils Relative %: 53.9 % (ref 43.0–77.0)
Platelets: 132 10*3/uL — ABNORMAL LOW (ref 150.0–400.0)
RBC: 4.78 Mil/uL (ref 3.87–5.11)
RDW: 16.2 % — ABNORMAL HIGH (ref 11.5–15.5)
WBC: 5 10*3/uL (ref 4.0–10.5)

## 2023-10-30 LAB — COMPREHENSIVE METABOLIC PANEL WITH GFR
ALT: 11 U/L (ref 0–35)
AST: 14 U/L (ref 0–37)
Albumin: 4 g/dL (ref 3.5–5.2)
Alkaline Phosphatase: 63 U/L (ref 39–117)
BUN: 13 mg/dL (ref 6–23)
CO2: 26 meq/L (ref 19–32)
Calcium: 8.9 mg/dL (ref 8.4–10.5)
Chloride: 106 meq/L (ref 96–112)
Creatinine, Ser: 0.9 mg/dL (ref 0.40–1.20)
GFR: 86.71 mL/min (ref >60.00)
Glucose, Bld: 78 mg/dL (ref 70–99)
Potassium: 3.9 meq/L (ref 3.5–5.1)
Sodium: 138 meq/L (ref 135–145)
Total Bilirubin: 0.4 mg/dL (ref 0.2–1.2)
Total Protein: 7.2 g/dL (ref 6.0–8.3)

## 2023-10-30 LAB — IRON: Iron: 66 ug/dL (ref 42–145)

## 2023-10-30 NOTE — Assessment & Plan Note (Signed)
Health maintenance reviewed and updated. Discussed nutrition, exercise. Follow-up 1 year.

## 2023-10-30 NOTE — Progress Notes (Signed)
 BP 116/78 (BP Location: Left Arm, Patient Position: Sitting, Cuff Size: Normal)   Pulse 66   Temp (!) 96.7 F (35.9 C) (Temporal)   Ht 5\' 5"  (1.651 m)   Wt 224 lb 3.2 oz (101.7 kg)   LMP 10/16/2023   SpO2 98%   BMI 37.31 kg/m    Subjective:    Patient ID: Jasmine Myers, female    DOB: Feb 13, 1996, 28 y.o.   MRN: [id number]  CC: Chief Complaint  Patient presents with   Annual Exam    No concerns not fasting    HPI: Jasmine Myers is a 28 y.o. female presenting on 10/30/2023 for comprehensive medical examination. Current medical complaints include:none  She currently lives with: kids, fiance Menopausal Symptoms: no  Depression and Anxiety Screen done today and results listed below:     10/30/2023    8:47 AM 10/27/2022    9:21 AM  Depression screen PHQ 2/9  Decreased Interest 0 1  Down, Depressed, Hopeless 0 0  PHQ - 2 Score 0 1  Altered sleeping 0 0  Tired, decreased energy 0 1  Change in appetite 0 1  Feeling bad or failure about yourself  0 0  Trouble concentrating 0 0  Moving slowly or fidgety/restless 0 0  Suicidal thoughts 0 0  PHQ-9 Score 0 3  Difficult doing work/chores Not difficult at all Not difficult at all      10/30/2023    8:48 AM 10/27/2022    9:21 AM  GAD 7 : Generalized Anxiety Score  Nervous, Anxious, on Edge 0 0  Control/stop worrying 0 0  Worry too much - different things 0 0  Trouble relaxing 0 1  Restless 0 0  Easily annoyed or irritable 0 0  Afraid - awful might happen 0 0  Total GAD 7 Score 0 1  Anxiety Difficulty Not difficult at all Not difficult at all    The patient does not have a history of falls. I did not complete a risk assessment for falls. A plan of care for falls was not documented.   Past Medical History:  Past Medical History:  Diagnosis Date   Chlamydia infection 07/29/2011   Medical history non-contributory     Surgical History:  Past Surgical History:  Procedure Laterality Date   NO PAST SURGERIES       Medications:  Current Outpatient Medications on File Prior to Visit  Medication Sig   etonogestrel (NEXPLANON) 68 MG IMPL implant 1 each by Subdermal route once.   ibuprofen  (ADVIL ) 800 MG tablet Take 1 tablet (800 mg total) by mouth every 8 (eight) hours as needed.   naproxen  (NAPROSYN ) 500 MG tablet Take 1 tablet (500 mg total) by mouth 2 (two) times daily as needed for mild pain.   ondansetron  (ZOFRAN ) 4 MG tablet Take 1 tablet (4 mg total) by mouth every 8 (eight) hours as needed.   Semaglutide -Weight Management (WEGOVY ) 1.7 MG/0.75ML SOAJ Inject 1.7 mg into the skin once a week.   iron  polysaccharides (NIFEREX) 150 MG capsule Take 1 capsule (150 mg total) by mouth every other day.   No current facility-administered medications on file prior to visit.    Allergies:  No Known Allergies  Social History:  Social History   Socioeconomic History   Marital status: Significant Other    Spouse name: Not on file   Number of children: 3   Years of education: Not on file   Highest education level: Some college, no degree  Occupational History   Not on file  Tobacco Use   Smoking status: Never   Smokeless tobacco: Never  Vaping Use   Vaping status: Never Used  Substance and Sexual Activity   Alcohol use: Yes    Comment: socially   Drug use: Never   Sexual activity: Yes    Birth control/protection: Implant  Other Topics Concern   Not on file  Social History Narrative   ** Merged History Encounter **       Social Drivers of Health   Financial Resource Strain: Low Risk  (09/12/2023)   Overall Financial Resource Strain (CARDIA)    Difficulty of Paying Living Expenses: Not hard at all  Food Insecurity: No Food Insecurity (09/12/2023)   Hunger Vital Sign    Worried About Running Out of Food in the Last Year: Never true    Ran Out of Food in the Last Year: Never true  Transportation Needs: No Transportation Needs (09/12/2023)   PRAPARE - Scientist, research (physical sciences) (Medical): No    Lack of Transportation (Non-Medical): No  Physical Activity: Insufficiently Active (09/12/2023)   Exercise Vital Sign    Days of Exercise per Week: 3 days    Minutes of Exercise per Session: 40 min  Stress: No Stress Concern Present (09/12/2023)   [employer] of Occupational Health - Occupational Stress Questionnaire    Feeling of Stress : Not at all  Social Connections: Moderately Isolated (09/12/2023)   Social Connection and Isolation Panel [NHANES]    Frequency of Communication with Friends and Family: More than three times a week    Frequency of Social Gatherings with Friends and Family: Once a week    Attends Religious Services: Never    Database administrator or Organizations: No    Attends Engineer, structural: Not on file    Marital Status: Living with partner  Intimate Partner Violence: Not on file   Social History   Tobacco Use  Smoking Status Never  Smokeless Tobacco Never   Social History   Substance and Sexual Activity  Alcohol Use Yes   Comment: socially    Family History:  Family History  Problem Relation Age of Onset   Arthritis/Rheumatoid Mother    ADD / ADHD Sister    ADD / ADHD Brother    Diabetes Maternal Aunt    Asthma Maternal Aunt    Hypertension Maternal Uncle    Cancer Maternal Uncle        prostate   Cancer Maternal Grandmother        lung - smoker    Past medical history, surgical history, medications, allergies, family history and social history reviewed with patient today and changes made to appropriate areas of the chart.   Review of Systems  Constitutional: Negative.   HENT: Negative.    Eyes: Negative.   Respiratory: Negative.    Cardiovascular: Negative.   Gastrointestinal:  Positive for nausea (at times). Negative for abdominal pain, blood in stool, constipation and diarrhea.  Genitourinary: Negative.   Musculoskeletal: Negative.   Skin: Negative.   Neurological: Negative.    Psychiatric/Behavioral: Negative.     All other ROS negative except what is listed above and in the HPI.      Objective:     BP 116/78 (BP Location: Left Arm, Patient Position: Sitting, Cuff Size: Normal)   Pulse 66   Temp (!) 96.7 F (35.9 C) (Temporal)   Ht 5\' 5"  (1.651 m)  Wt 224 lb 3.2 oz (101.7 kg)   LMP 10/16/2023   SpO2 98%   BMI 37.31 kg/m   Wt Readings from Last 3 Encounters:  10/30/23 224 lb 3.2 oz (101.7 kg)  09/12/23 235 lb (106.6 kg)  06/27/23 243 lb 12.8 oz (110.6 kg)    Physical Exam Vitals and nursing note reviewed.  Constitutional:      General: She is not in acute distress.    Appearance: Normal appearance.  HENT:     Head: Normocephalic and atraumatic.     Right Ear: Tympanic membrane, ear canal and external ear normal.     Left Ear: Tympanic membrane, ear canal and external ear normal.     Mouth/Throat:     Mouth: Mucous membranes are moist.     Pharynx: No posterior oropharyngeal erythema.  Eyes:     Conjunctiva/sclera: Conjunctivae normal.  Cardiovascular:     Rate and Rhythm: Normal rate and regular rhythm.     Pulses: Normal pulses.     Heart sounds: Normal heart sounds.  Pulmonary:     Effort: Pulmonary effort is normal.     Breath sounds: Normal breath sounds.  Abdominal:     Palpations: Abdomen is soft.     Tenderness: There is no abdominal tenderness.  Musculoskeletal:        General: Normal range of motion.     Cervical back: Normal range of motion and neck supple.     Right lower leg: No edema.     Left lower leg: No edema.  Lymphadenopathy:     Cervical: No cervical adenopathy.  Skin:    General: Skin is warm and dry.  Neurological:     General: No focal deficit present.     Mental Status: She is alert and oriented to person, place, and time.     Cranial Nerves: No cranial nerve deficit.     Coordination: Coordination normal.     Gait: Gait normal.  Psychiatric:        Mood and Affect: Mood normal.        Behavior:  Behavior normal.        Thought Content: Thought content normal.        Judgment: Judgment normal.     Results for orders placed or performed in visit on 09/12/23  HM PAP SMEAR   Collection Time: 03/09/21 12:00 AM  Result Value Ref Range   HM Pap smear Negative for intraepithelial Lesion or malignancy   Results Console HPV   Collection Time: 03/09/21 12:00 AM  Result Value Ref Range   CHL HPV Negative       Assessment & Plan:   Problem List Items Addressed This Visit       Other   IDA (iron  deficiency anemia)   Chronic, ongoing.  Her most recent ferritin was 3.  She states that most over-the-counter supplements upset her stomach if she takes them with or without food.  She is still having constipation with prenatal and iron  supplement. Will check CBC, iron , ferritin, and if still low refer to hematology for iron  infusion.       Relevant Orders   CBC with Differential/Platelet   Comprehensive metabolic panel with GFR   Iron    Ferritin   Routine general medical examination at a health care facility - Primary   Health maintenance reviewed and updated. Discussed nutrition, exercise. Follow-up 1 year.        Obesity (BMI 30-39.9)   BMI 37.3. She has  lost another 11 pounds since her last visit. Continue wegovy  1.7mg  weekly and continue to focus on nutrition and exercise. Follow-up in 3 months.         Follow up plan: Return in about 3 months (around 01/30/2024) for weight management .   LABORATORY TESTING:  - Pap smear: up to date  IMMUNIZATIONS:   - Tdap: Tetanus vaccination status reviewed: last tetanus booster within 10 years. - Influenza: Postponed to flu season - Pneumovax: Not applicable - Prevnar: Not applicable - HPV: Up to date - Shingrix vaccine: Not applicable  SCREENING: -Mammogram: Not applicable  - Colonoscopy: Not applicable  - Bone Density: Not applicable   PATIENT COUNSELING:   Advised to take 1 mg of folate supplement per day if capable of  pregnancy.   Sexuality: Discussed sexually transmitted diseases, partner selection, use of condoms, avoidance of unintended pregnancy  and contraceptive alternatives.   Advised to avoid cigarette smoking.  I discussed with the patient that most people either abstain from alcohol or drink within safe limits (<=14/week and <=4 drinks/occasion for males, <=7/weeks and <= 3 drinks/occasion for females) and that the risk for alcohol disorders and other health effects rises proportionally with the number of drinks per week and how often a drinker exceeds daily limits.  Discussed cessation/primary prevention of drug use and availability of treatment for abuse.   Diet: Encouraged to adjust caloric intake to maintain  or achieve ideal body weight, to reduce intake of dietary saturated fat and total fat, to limit sodium intake by avoiding high sodium foods and not adding table salt, and to maintain adequate dietary potassium and calcium preferably from fresh fruits, vegetables, and low-fat dairy products.    stressed the importance of regular exercise  Injury prevention: Discussed safety belts, safety helmets, smoke detector, smoking near bedding or upholstery.   Dental health: Discussed importance of regular tooth brushing, flossing, and dental visits.    NEXT PREVENTATIVE PHYSICAL DUE IN 1 YEAR. Return in about 3 months (around 01/30/2024) for weight management .  Lorrine Killilea A Randell Teare

## 2023-10-30 NOTE — Patient Instructions (Signed)
It was great to see you!  We are checking your labs today and will let you know the results via mychart/phone.   Keep up the great work!   Let's follow-up in 3 months, sooner if you have concerns.  If a referral was placed today, you will be contacted for an appointment. Please note that routine referrals can sometimes take up to 3-4 weeks to process. Please call our office if you haven't heard anything after this time frame.  Take care,  Rodman Pickle, NP

## 2023-10-30 NOTE — Assessment & Plan Note (Signed)
 Chronic, ongoing.  Her most recent ferritin was 3.  She states that most over-the-counter supplements upset her stomach if she takes them with or without food.  She is still having constipation with prenatal and iron  supplement. Will check CBC, iron , ferritin, and if still low refer to hematology for iron  infusion.

## 2023-10-30 NOTE — Assessment & Plan Note (Signed)
 BMI 37.3. She has lost another 11 pounds since her last visit. Continue wegovy  1.7mg  weekly and continue to focus on nutrition and exercise. Follow-up in 3 months.

## 2023-10-31 LAB — FERRITIN: Ferritin: 10.8 ng/mL (ref 10.0–291.0)

## 2023-11-01 ENCOUNTER — Ambulatory Visit: Payer: Self-pay | Admitting: Nurse Practitioner

## 2023-11-26 ENCOUNTER — Other Ambulatory Visit: Payer: Self-pay | Admitting: Nurse Practitioner

## 2023-11-28 NOTE — Telephone Encounter (Signed)
 Requesting: ondansetron  (ZOFRAN ) 4 MG tablet  Last Visit: 10/30/2023 Next Visit: 01/30/2024 Last Refill: 06/27/2023  Please Advise

## 2023-11-29 ENCOUNTER — Other Ambulatory Visit (HOSPITAL_COMMUNITY): Payer: Self-pay

## 2023-11-29 MED ORDER — ONDANSETRON HCL 4 MG PO TABS
4.0000 mg | ORAL_TABLET | Freq: Three times a day (TID) | ORAL | 0 refills | Status: DC | PRN
  Filled 2023-11-29: qty 30, 10d supply, fill #0

## 2023-12-02 ENCOUNTER — Other Ambulatory Visit (HOSPITAL_COMMUNITY): Payer: Self-pay

## 2023-12-04 ENCOUNTER — Telehealth: Payer: Self-pay

## 2023-12-04 ENCOUNTER — Other Ambulatory Visit (HOSPITAL_COMMUNITY): Payer: Self-pay

## 2023-12-04 NOTE — Telephone Encounter (Signed)
 Pharmacy Patient Advocate Encounter   Received notification from CoverMyMeds that prior authorization for Wegovy  1.7 is required/requested.   Insurance verification completed.   The patient is insured through [facility] .   Per test claim: PA required; PA submitted to above mentioned insurance via CoverMyMeds Key/confirmation #/EOC [id number] Status is pending

## 2023-12-05 ENCOUNTER — Other Ambulatory Visit (HOSPITAL_COMMUNITY): Payer: Self-pay

## 2023-12-05 NOTE — Telephone Encounter (Signed)
 Pharmacy Patient Advocate Encounter  Received notification from [facility] that Prior Authorization for Wegovyhas been APPROVED from 12/04/23 to 12/03/24   PA #/Case ID/Reference #: [id number]

## 2023-12-06 ENCOUNTER — Other Ambulatory Visit (HOSPITAL_COMMUNITY): Payer: Self-pay

## 2023-12-06 MED ORDER — FLUCONAZOLE 150 MG PO TABS
ORAL_TABLET | ORAL | 0 refills | Status: AC
  Filled 2023-12-06: qty 2, 2d supply, fill #0

## 2024-01-15 ENCOUNTER — Other Ambulatory Visit: Payer: Self-pay | Admitting: Nurse Practitioner

## 2024-01-16 ENCOUNTER — Other Ambulatory Visit (HOSPITAL_COMMUNITY): Payer: Self-pay

## 2024-01-16 MED ORDER — WEGOVY 1.7 MG/0.75ML ~~LOC~~ SOAJ
1.7000 mg | SUBCUTANEOUS | 2 refills | Status: DC
  Filled 2024-01-16: qty 3, 28d supply, fill #0

## 2024-01-16 NOTE — Telephone Encounter (Signed)
 Requesting: Semaglutide -Weight Management (WEGOVY ) 1.7 MG/0.75ML SOAJ  Last Visit: 10/30/2023 Next Visit: 01/30/2024 Last Refill: 09/12/2023  Please Advise

## 2024-01-30 ENCOUNTER — Ambulatory Visit: Admitting: Nurse Practitioner

## 2024-02-01 ENCOUNTER — Ambulatory Visit: Admitting: Nurse Practitioner

## 2024-02-08 ENCOUNTER — Ambulatory Visit: Admitting: Nurse Practitioner

## 2024-02-09 ENCOUNTER — Encounter: Payer: Self-pay | Admitting: Nurse Practitioner

## 2024-02-09 ENCOUNTER — Telehealth: Payer: Self-pay | Admitting: Nurse Practitioner

## 2024-02-09 NOTE — Telephone Encounter (Signed)
 Noted

## 2024-02-09 NOTE — Telephone Encounter (Signed)
 02/01/2024 same day cancel/stomach bug 02/08/2024 no show  Final warning sent via mail and mychart

## 2024-02-09 NOTE — Telephone Encounter (Unsigned)
 Copied from CRM [id number]. Topic: Clinical - Request for Lab/Test Order >> Feb 09, 2024  8:57 AM Revonda D wrote: Reason for CRM: Pt is requesting to have blood labs ordered and done today if possible. Pt stated that she thinks she may be pregnant but the levels are low and wanted to get labs to be sure.

## 2024-02-20 ENCOUNTER — Encounter: Payer: Self-pay | Admitting: Nurse Practitioner

## 2024-02-20 ENCOUNTER — Ambulatory Visit: Admitting: Nurse Practitioner

## 2024-02-20 VITALS — BP 106/68 | HR 80 | Temp 97.3°F | Ht 65.0 in | Wt 203.6 lb

## 2024-02-20 DIAGNOSIS — N926 Irregular menstruation, unspecified: Secondary | ICD-10-CM

## 2024-02-20 DIAGNOSIS — K5903 Drug induced constipation: Secondary | ICD-10-CM

## 2024-02-20 DIAGNOSIS — Z23 Encounter for immunization: Secondary | ICD-10-CM | POA: Diagnosis not present

## 2024-02-20 DIAGNOSIS — D500 Iron deficiency anemia secondary to blood loss (chronic): Secondary | ICD-10-CM

## 2024-02-20 DIAGNOSIS — E669 Obesity, unspecified: Secondary | ICD-10-CM

## 2024-02-20 DIAGNOSIS — Z6833 Body mass index (BMI) 33.0-33.9, adult: Secondary | ICD-10-CM

## 2024-02-20 DIAGNOSIS — R110 Nausea: Secondary | ICD-10-CM

## 2024-02-20 LAB — COMPREHENSIVE METABOLIC PANEL WITH GFR
ALT: 12 U/L (ref 0–35)
AST: 13 U/L (ref 0–37)
Albumin: 4 g/dL (ref 3.5–5.2)
Alkaline Phosphatase: 66 U/L (ref 39–117)
BUN: 11 mg/dL (ref 6–23)
CO2: 27 meq/L (ref 19–32)
Calcium: 9 mg/dL (ref 8.4–10.5)
Chloride: 106 meq/L (ref 96–112)
Creatinine, Ser: 0.88 mg/dL (ref 0.40–1.20)
GFR: 88.89 mL/min (ref >60.00)
Glucose, Bld: 74 mg/dL (ref 70–99)
Potassium: 3.9 meq/L (ref 3.5–5.1)
Sodium: 139 meq/L (ref 135–145)
Total Bilirubin: 0.4 mg/dL (ref 0.2–1.2)
Total Protein: 7.1 g/dL (ref 6.0–8.3)

## 2024-02-20 LAB — CBC WITH DIFFERENTIAL/PLATELET
Basophils Absolute: 0 K/uL (ref 0.0–0.1)
Basophils Relative: 0.6 % (ref 0.0–3.0)
Eosinophils Absolute: 0.1 K/uL (ref 0.0–0.7)
Eosinophils Relative: 1.8 % (ref 0.0–5.0)
HCT: 37.1 % (ref 36.0–46.0)
Hemoglobin: 11.9 g/dL — ABNORMAL LOW (ref 12.0–15.0)
Lymphocytes Relative: 37.5 % (ref 12.0–46.0)
Lymphs Abs: 1.9 K/uL (ref 0.7–4.0)
MCHC: 32.1 g/dL (ref 30.0–36.0)
MCV: 81.3 fl (ref 78.0–100.0)
Monocytes Absolute: 0.4 K/uL (ref 0.1–1.0)
Monocytes Relative: 8.4 % (ref 3.0–12.0)
Neutro Abs: 2.6 K/uL (ref 1.4–7.7)
Neutrophils Relative %: 51.7 % (ref 43.0–77.0)
Platelets: 134 K/uL — ABNORMAL LOW (ref 150.0–400.0)
RBC: 4.56 Mil/uL (ref 3.87–5.11)
RDW: 14.4 % (ref 11.5–15.5)
WBC: 5 K/uL (ref 4.0–10.5)

## 2024-02-20 LAB — TSH: TSH: 0.43 u[IU]/mL (ref 0.35–5.50)

## 2024-02-20 LAB — IRON: Iron: 29 ug/dL — ABNORMAL LOW (ref 42–145)

## 2024-02-20 LAB — FERRITIN: Ferritin: 8.7 ng/mL — ABNORMAL LOW (ref 10.0–291.0)

## 2024-02-20 MED ORDER — ONDANSETRON HCL 4 MG PO TABS: 4.0000 mg | ORAL_TABLET | Freq: Three times a day (TID) | ORAL | 0 refills | Status: AC | PRN

## 2024-02-20 NOTE — Patient Instructions (Signed)
 It was great to see you!  We are checking your labs today and will let you know the results via mychart/phone.   Keep drinking plenty of fluids   Decrease miralax to twice a week then as needed   Reach out to GYN to schedule an appointment   Let's follow-up in 8 months, sooner if you have concerns.  If a referral was placed today, you will be contacted for an appointment. Please note that routine referrals can sometimes take up to 3-4 weeks to process. Please call our office if you haven't heard anything after this time frame.  Take care,  Tinnie Harada, NP

## 2024-02-20 NOTE — Progress Notes (Unsigned)
 Established Patient Office Visit  Subjective   Patient ID: Jasmine Myers, female    DOB: Aug 11, 1995  Age: 28 y.o. MRN: [id number]  Chief Complaint  Patient presents with   Weight Management    Follow up, Rx refill, Flu Vaccine, Irregular bleeding since having Nexplanon    HPI Discussed the use of AI scribe software for clinical note transcription with the patient, who gave verbal consent to proceed.  History of Present Illness   Jasmine Myers is a 28 year old female who presents with persistent nausea, gastrointestinal issues, and prolonged menstrual bleeding.  She experiences episodes of vomiting and diarrhea between 3 and 5 AM, lasting until noon, occurring every other week. Because of these symptoms, she stopped her Wegovy  on January 28, 2024. Following cessation, she experienced daily morning nausea, which has recently improved. A home pregnancy test showed a faint line, but a subsequent urine test confirmed she is not pregnant. She was informed of significant stool retention and was prescribed Miralax. She takes Miralax every other day, resulting in improved bowel movements.  Continuous menstrual bleeding has been present since December 16, 2023, with daily bright red bleeding in the mornings and occasional clotting. Previously, she experienced irregular bleeding with periods lasting about two weeks every other month. She has not been taking iron  supplements but plans to restart them due to prolonged bleeding. She has the nexplanon in place that was placed 2 years ago.   She has lost 58 pounds over the past year, currently weighing 203 pounds, due to portion control and regular gym attendance.       ROS See pertinent positives and negatives per HPI.    Objective:     BP 106/68 (BP Location: Right Arm, Patient Position: Sitting, Cuff Size: Normal)   Pulse 80   Temp (!) 97.3 F (36.3 C)   Ht 5' 5 (1.651 m)   Wt 203 lb 9.6 oz (92.4 kg)   SpO2 100%   BMI 33.88 kg/m  BP Readings  from Last 3 Encounters:  02/20/24 106/68  10/30/23 116/78  09/12/23 108/68   Wt Readings from Last 3 Encounters:  02/20/24 203 lb 9.6 oz (92.4 kg)  10/30/23 224 lb 3.2 oz (101.7 kg)  09/12/23 235 lb (106.6 kg)      Physical Exam Vitals and nursing note reviewed.  Constitutional:      General: She is not in acute distress.    Appearance: Normal appearance.  HENT:     Head: Normocephalic.  Eyes:     Conjunctiva/sclera: Conjunctivae normal.  Cardiovascular:     Rate and Rhythm: Normal rate and regular rhythm.     Pulses: Normal pulses.     Heart sounds: Normal heart sounds.  Pulmonary:     Effort: Pulmonary effort is normal.     Breath sounds: Normal breath sounds.  Abdominal:     Palpations: Abdomen is soft.     Tenderness: There is no abdominal tenderness.  Musculoskeletal:     Cervical back: Normal range of motion.  Skin:    General: Skin is warm.  Neurological:     General: No focal deficit present.     Mental Status: She is alert and oriented to person, place, and time.  Psychiatric:        Mood and Affect: Mood normal.        Behavior: Behavior normal.        Thought Content: Thought content normal.        Judgment: Judgment  normal.      Assessment & Plan:   Problem List Items Addressed This Visit       Digestive   Drug-induced constipation   Constipation is linked to Wegovy  use, which improved with cessation and Miralax. She prefers non-daily use. Continue Miralax every other day until regular bowel movements occur and advise reducing frequency as bowel movements normalize.        Other   IDA (iron  deficiency anemia) - Primary   Potential iron  deficiency anemia is suspected from prolonged bleeding. She is not on iron  supplements. Check CBC, iron  and ferritin levels and advise restarting iron  supplementation.      Relevant Orders   CBC with Differential/Platelet (Completed)   Iron  (Completed)   Ferritin (Completed)   Obesity (BMI 30-39.9)   BMI  33.8. She has lost 61 pounds over the last year. Encourage continuation of the current diet and exercise regimen.       Irregular uterine bleeding   She experiences persistent abnormal uterine bleeding with daily bleeding and clot passage. The differential includes Nexplanon side effects and hormonal imbalances. A urine pregnancy test is negative. Check CBC, CMP, TSH, FSH, and LH today. Encouraged her to follow-up with GYN.       Relevant Orders   CBC with Differential/Platelet (Completed)   Comprehensive metabolic panel with GFR (Completed)   TSH (Completed)   FSH/LH (Completed)   Other Visit Diagnoses       Nausea       Nausea has improved since stopping wegovy  and regular bowel movements. Continue zofran  4mg  every 8 hours as needed for nausea.     Immunization due       Flu vaccine given today   Relevant Orders   Flu vaccine trivalent PF, 6mos and older(Flulaval,Afluria,Fluarix,Fluzone) (Completed)       Return in about 8 months (around 10/19/2024) for CPE.    Tinnie DELENA Harada, NP

## 2024-02-21 ENCOUNTER — Ambulatory Visit: Payer: Self-pay | Admitting: Nurse Practitioner

## 2024-02-21 DIAGNOSIS — K5903 Drug induced constipation: Secondary | ICD-10-CM | POA: Insufficient documentation

## 2024-02-21 DIAGNOSIS — N926 Irregular menstruation, unspecified: Secondary | ICD-10-CM | POA: Insufficient documentation

## 2024-02-21 LAB — FSH/LH
FSH: 8.3 m[IU]/mL
LH: 11.1 m[IU]/mL

## 2024-02-21 MED ORDER — POLYSACCHARIDE IRON COMPLEX 150 MG PO CAPS: 150.0000 mg | ORAL_CAPSULE | ORAL | 1 refills | Status: AC

## 2024-02-21 NOTE — Assessment & Plan Note (Signed)
 She experiences persistent abnormal uterine bleeding with daily bleeding and clot passage. The differential includes Nexplanon side effects and hormonal imbalances. A urine pregnancy test is negative. Check CBC, CMP, TSH, FSH, and LH today. Encouraged her to follow-up with GYN.

## 2024-02-21 NOTE — Assessment & Plan Note (Signed)
 BMI 33.8. She has lost 61 pounds over the last year. Encourage continuation of the current diet and exercise regimen.

## 2024-02-21 NOTE — Assessment & Plan Note (Signed)
 Potential iron  deficiency anemia is suspected from prolonged bleeding. She is not on iron  supplements. Check CBC, iron  and ferritin levels and advise restarting iron  supplementation.

## 2024-02-21 NOTE — Assessment & Plan Note (Signed)
 Constipation is linked to Wegovy  use, which improved with cessation and Miralax. She prefers non-daily use. Continue Miralax every other day until regular bowel movements occur and advise reducing frequency as bowel movements normalize.

## 2024-06-27 ENCOUNTER — Encounter: Payer: Self-pay | Admitting: Nurse Practitioner

## 2024-06-27 DIAGNOSIS — E669 Obesity, unspecified: Secondary | ICD-10-CM

## 2024-06-28 MED ORDER — WEGOVY 0.25 MG/0.5ML ~~LOC~~ SOAJ: 0.2500 mg | SUBCUTANEOUS | 1 refills | Status: AC

## 2024-07-02 ENCOUNTER — Telehealth: Payer: Self-pay

## 2024-07-02 NOTE — Telephone Encounter (Signed)
 Can we get a prior auth on semaglutide-weight management (WEGOVY) 0.25 MG/0.5ML SOAJ SQ injection?

## 2024-07-02 NOTE — Telephone Encounter (Signed)
 Patient will call back to make an appointment.

## 2024-07-05 ENCOUNTER — Other Ambulatory Visit (HOSPITAL_COMMUNITY): Payer: Self-pay

## 2024-07-05 ENCOUNTER — Telehealth: Payer: Self-pay

## 2024-07-05 NOTE — Telephone Encounter (Signed)
 Pharmacy Patient Advocate Encounter   Received notification from Pt Calls Messages that prior authorization for Wegovy  0.25mg /0.62ml is required/requested.   Insurance verification completed.   The patient is insured through [employer].   Per test claim: Insurance companies are becoming increasingly stricter about requiring thorough documentation of lifestyle modifications in the patient's chart at each visit. This includes detailed records of diet recommendations (caloric intake, etc), exercise plans (amount of time/wk, etc), an emphasis on the patient's commitment to continuing these efforts while on medication, include no contraindications to GLP-1 therapy, thyroid cancer history, or concurrent use of GLP-1s.  Without this additional documentation in the chart notes, a prior authorization will most likely be denied.

## 2024-07-09 NOTE — Telephone Encounter (Signed)
 I called and spoke with patient and scheduled her for an appointment.

## 2024-07-11 ENCOUNTER — Encounter: Payer: Self-pay | Admitting: Nurse Practitioner

## 2024-07-11 ENCOUNTER — Ambulatory Visit: Admitting: Nurse Practitioner

## 2024-07-11 VITALS — BP 116/70 | HR 68 | Temp 97.3°F | Ht 65.0 in | Wt 217.6 lb

## 2024-07-11 DIAGNOSIS — E669 Obesity, unspecified: Secondary | ICD-10-CM | POA: Diagnosis not present

## 2024-07-11 DIAGNOSIS — N926 Irregular menstruation, unspecified: Secondary | ICD-10-CM

## 2024-07-11 DIAGNOSIS — D500 Iron deficiency anemia secondary to blood loss (chronic): Secondary | ICD-10-CM | POA: Diagnosis not present

## 2024-07-11 NOTE — Assessment & Plan Note (Signed)
 Iron  deficiency anemia is secondary to chronic blood loss from irregular uterine bleeding. Previous polysaccharide iron  supplements caused gastrointestinal upset. Check CBC, iron , ferritin today.

## 2024-07-11 NOTE — Progress Notes (Signed)
 "   Established Patient Office Visit Subjective:    Patient ID: Jasmine Myers, female    DOB: 1995/12/03, 29 y.o.   MRN: [id number]  Chief Complaint Chief Complaint  Patient presents with   Weight Management    Follow up, concerns with medication     HPI Jasmine Myers is a 29 y.o. female who presents for weight management.  Discussed the use of AI scribe software for clinical note transcription with the patient, who gave verbal consent to proceed.  She reports significant recent stress from moving to [place] and her middle child's new autism diagnosis, which disrupted her prior gym routine. She has been stress eating with weight gain.  Since September she has gained weight. She is overeating, usually one large meal a day, and frequently eating out due to not cooking at home. She wants to resume a structured routine with gym attendance, meal planning, and more home-cooked meals.  She has irregular menstrual bleeding. After a normal period in December, she has had continuous bleeding since January 13th. She tried to schedule with her gynecologist but could not get an appointment and is considering changing providers due to access issues.  She recently stopped polysaccharide iron  complex about a month ago because it made her feel sick. She has not noticed any symptom changes since stopping it. She denies chest pain and shortness of breath.     Review of Systems See pertinent positives and negatives per HPI.     Objective:    BP 116/70 (BP Location: Left Arm, Patient Position: Sitting, Cuff Size: Normal)   Pulse 68   Temp (!) 97.3 F (36.3 C)   Ht 5' 5 (1.651 m)   Wt 217 lb 9.6 oz (98.7 kg)   LMP 07/11/2024 (Exact Date)   SpO2 100%   BMI 36.21 kg/m   BP Readings from Last 3 Encounters:  07/11/24 116/70  02/20/24 106/68  10/30/23 116/78   Wt Readings from Last 3 Encounters:  07/11/24 217 lb 9.6 oz (98.7 kg)  02/20/24 203 lb 9.6 oz (92.4 kg)  10/30/23 224 lb 3.2 oz  (101.7 kg)   Physical Exam Vitals and nursing note reviewed.  Constitutional:      General: She is not in acute distress.    Appearance: Normal appearance.  HENT:     Head: Normocephalic.  Eyes:     Conjunctiva/sclera: Conjunctivae normal.  Cardiovascular:     Rate and Rhythm: Normal rate and regular rhythm.     Pulses: Normal pulses.     Heart sounds: Normal heart sounds.  Pulmonary:     Effort: Pulmonary effort is normal.     Breath sounds: Normal breath sounds.  Musculoskeletal:     Cervical back: Normal range of motion.  Skin:    General: Skin is warm.  Neurological:     General: No focal deficit present.     Mental Status: She is alert and oriented to person, place, and time.  Psychiatric:        Mood and Affect: Mood normal.        Behavior: Behavior normal.        Thought Content: Thought content normal.        Judgment: Judgment normal.        Assessment & Plan:   Problem List Items Addressed This Visit       Other   IDA (iron  deficiency anemia)   Iron  deficiency anemia is secondary to chronic blood loss from irregular uterine bleeding. Previous  polysaccharide iron  supplements caused gastrointestinal upset. Check CBC, iron , ferritin today.       Relevant Orders   CBC with Differential/Platelet   Ferritin   Iron    Obesity (BMI 30-39.9)   BMI 36.2. Recent weight gain is attributed to stress and lifestyle changes. She is going to resume her exercise routine at [employer] 3 times a week and meal planning. A daily caloric intake of 1500-1600 calories is advised for weight loss, with an emphasis on protein, vegetables, and hydration. Stress management techniques are recommended. Will restart wegovy  at 0.25mg  weekly in addition to the lifestyle changes. Check A1c today. Follow up in 6-8 weeks.       Relevant Orders   Hemoglobin A1c   Irregular uterine bleeding - Primary   She has experienced persistent irregular uterine bleeding for the last several months  with the nexplanon. A referral to [facility] in [place] is made for evaluation and management.      Relevant Orders   Ambulatory referral to Gynecology    Tinnie DELENA Harada, NP  I,Emily Lagle,acting as a scribe for Tinnie DELENA Harada, NP.,have documented all relevant documentation on the behalf of Asami Lambright DELENA Harada, NP.  I, Tinnie DELENA Harada, NP, have reviewed all documentation for this visit. The documentation on 07/11/2024 for the exam, diagnosis, procedures, and orders are all accurate and complete. "

## 2024-07-11 NOTE — Assessment & Plan Note (Addendum)
 BMI 36.2. Recent weight gain is attributed to stress and lifestyle changes. She is going to resume her exercise routine at [employer] 3 times a week and meal planning. A daily caloric intake of 1500-1600 calories is advised for weight loss, with an emphasis on protein, vegetables, and hydration. Stress management techniques are recommended. Will restart wegovy  at 0.25mg  weekly in addition to the lifestyle changes. Check A1c today. Follow up in 6-8 weeks.

## 2024-07-11 NOTE — Patient Instructions (Signed)
 It was great to see you!  We are checking your labs today and will let you know the results via mychart/phone.   I have placed a referral to GYN, they should call to schedule   We will do the prior auth for the wegovy    Try to aim for 1500-1600 calories per day focusing on protein and vegetables.   Let's follow-up in 6-8 weeks, sooner if you have concerns.  If a referral was placed today, you will be contacted for an appointment. Please note that routine referrals can sometimes take up to 3-4 weeks to process. Please call our office if you haven't heard anything after this time frame.  Take care,  Tinnie Harada, NP

## 2024-07-11 NOTE — Assessment & Plan Note (Signed)
 She has experienced persistent irregular uterine bleeding for the last several months with the nexplanon. A referral to [facility] in [place] is made for evaluation and management.

## 2024-07-12 ENCOUNTER — Ambulatory Visit: Payer: Self-pay | Admitting: Nurse Practitioner

## 2024-07-12 LAB — CBC WITH DIFFERENTIAL/PLATELET
Basophils Absolute: 0.1 10*3/uL (ref 0.0–0.1)
Basophils Relative: 1.6 % (ref 0.0–3.0)
Eosinophils Absolute: 0.2 10*3/uL (ref 0.0–0.7)
Eosinophils Relative: 3.9 % (ref 0.0–5.0)
HCT: 36.7 % (ref 36.0–46.0)
Hemoglobin: 11.8 g/dL — ABNORMAL LOW (ref 12.0–15.0)
Lymphocytes Relative: 42.8 % (ref 12.0–46.0)
Lymphs Abs: 1.8 10*3/uL (ref 0.7–4.0)
MCHC: 32.1 g/dL (ref 30.0–36.0)
MCV: 78.4 fl (ref 78.0–100.0)
Monocytes Absolute: 0.4 10*3/uL (ref 0.1–1.0)
Monocytes Relative: 8.8 % (ref 3.0–12.0)
Neutro Abs: 1.8 10*3/uL (ref 1.4–7.7)
Neutrophils Relative %: 42.9 % — ABNORMAL LOW (ref 43.0–77.0)
Platelets: 145 10*3/uL — ABNORMAL LOW (ref 150.0–400.0)
RBC: 4.69 Mil/uL (ref 3.87–5.11)
RDW: 15.4 % (ref 11.5–15.5)
WBC: 4.1 10*3/uL (ref 4.0–10.5)

## 2024-07-12 LAB — IRON: Iron: 42 ug/dL (ref 42–145)

## 2024-07-12 LAB — FERRITIN: Ferritin: 6.8 ng/mL — ABNORMAL LOW (ref 10.0–291.0)

## 2024-07-12 LAB — HEMOGLOBIN A1C: Hgb A1c MFr Bld: 5.2 % (ref 4.6–6.5)

## 2024-07-15 ENCOUNTER — Telehealth: Payer: Self-pay

## 2024-07-15 ENCOUNTER — Other Ambulatory Visit (HOSPITAL_COMMUNITY): Payer: Self-pay

## 2024-07-15 NOTE — Telephone Encounter (Signed)
 Pharmacy Patient Advocate Encounter   Received notification from Pt Calls Messages that prior authorization for Wegovy  0.25mg /0.4ml is required/requested.   Insurance verification completed.   The patient is insured through [employer].   Per test claim: PA required; PA submitted to above mentioned insurance via Latent Key/confirmation #/[facility] Status is pending

## 2024-08-22 ENCOUNTER — Ambulatory Visit: Admitting: Nurse Practitioner

## 2024-10-31 ENCOUNTER — Encounter: Admitting: Nurse Practitioner
